# Patient Record
Sex: Female | Born: 1990
Health system: Southern US, Community
[De-identification: ages and names within clinical notes are randomized; demographics above are authoritative.]

## PROBLEM LIST (undated history)

## (undated) ENCOUNTER — Inpatient Hospital Stay (HOSPITAL_COMMUNITY): Payer: Self-pay

## (undated) DIAGNOSIS — Z789 Other specified health status: Secondary | ICD-10-CM

## (undated) HISTORY — PX: WISDOM TOOTH EXTRACTION: SHX21

## (undated) HISTORY — PX: APPENDECTOMY: SHX54

---

## 2013-05-02 ENCOUNTER — Emergency Department (HOSPITAL_COMMUNITY)
Admission: EM | Admit: 2013-05-02 | Discharge: 2013-05-02 | Disposition: A | Discharge status: Home / Self Care | Payer: BC Managed Care – PPO | Attending: Emergency Medicine | Admitting: Emergency Medicine

## 2013-05-02 ENCOUNTER — Encounter (HOSPITAL_COMMUNITY): Payer: Self-pay | Admitting: Emergency Medicine

## 2013-05-02 ENCOUNTER — Emergency Department (HOSPITAL_COMMUNITY): Payer: BC Managed Care – PPO

## 2013-05-02 DIAGNOSIS — Z9889 Other specified postprocedural states: Secondary | ICD-10-CM | POA: Insufficient documentation

## 2013-05-02 DIAGNOSIS — R1031 Right lower quadrant pain: Secondary | ICD-10-CM | POA: Insufficient documentation

## 2013-05-02 DIAGNOSIS — R109 Unspecified abdominal pain: Secondary | ICD-10-CM

## 2013-05-02 DIAGNOSIS — N898 Other specified noninflammatory disorders of vagina: Secondary | ICD-10-CM | POA: Insufficient documentation

## 2013-05-02 DIAGNOSIS — Z3202 Encounter for pregnancy test, result negative: Secondary | ICD-10-CM | POA: Insufficient documentation

## 2013-05-02 DIAGNOSIS — R112 Nausea with vomiting, unspecified: Secondary | ICD-10-CM | POA: Insufficient documentation

## 2013-05-02 DIAGNOSIS — Z880 Allergy status to penicillin: Secondary | ICD-10-CM | POA: Insufficient documentation

## 2013-05-02 DIAGNOSIS — R1032 Left lower quadrant pain: Secondary | ICD-10-CM | POA: Insufficient documentation

## 2013-05-02 DIAGNOSIS — M545 Low back pain, unspecified: Secondary | ICD-10-CM | POA: Insufficient documentation

## 2013-05-02 DIAGNOSIS — R630 Anorexia: Secondary | ICD-10-CM | POA: Insufficient documentation

## 2013-05-02 LAB — COMPREHENSIVE METABOLIC PANEL
ALT: 11 U/L (ref 0–35)
AST: 15 U/L (ref 0–37)
Albumin: 4.1 g/dL (ref 3.5–5.2)
Alkaline Phosphatase: 52 U/L (ref 39–117)
BUN: 13 mg/dL (ref 6–23)
CHLORIDE: 102 meq/L (ref 96–112)
CO2: 19 mEq/L (ref 19–32)
Calcium: 9.5 mg/dL (ref 8.4–10.5)
Creatinine, Ser: 0.99 mg/dL (ref 0.50–1.10)
GFR calc Af Amer: >90 mL/min (ref >90)
GFR calc non Af Amer: 79 mL/min — ABNORMAL LOW (ref >90)
Glucose, Bld: 82 mg/dL (ref 70–99)
Potassium: 3.7 mEq/L (ref 3.7–5.3)
Sodium: 138 mEq/L (ref 137–147)
Total Bilirubin: 0.4 mg/dL (ref 0.3–1.2)
Total Protein: 7.5 g/dL (ref 6.0–8.3)

## 2013-05-02 LAB — CBC WITH DIFFERENTIAL/PLATELET
BASOS ABS: 0 10*3/uL (ref 0.0–0.1)
Basophils Relative: 0 % (ref 0–1)
Eosinophils Absolute: 0 10*3/uL (ref 0.0–0.7)
Eosinophils Relative: 0 % (ref 0–5)
HCT: 37.5 % (ref 36.0–46.0)
Hemoglobin: 13.4 g/dL (ref 12.0–15.0)
LYMPHS ABS: 2 10*3/uL (ref 0.7–4.0)
Lymphocytes Relative: 15 % (ref 12–46)
MCH: 27.6 pg (ref 26.0–34.0)
MCHC: 35.7 g/dL (ref 30.0–36.0)
MCV: 77.2 fL — ABNORMAL LOW (ref 78.0–100.0)
MONOS PCT: 5 % (ref 3–12)
Monocytes Absolute: 0.6 10*3/uL (ref 0.1–1.0)
NEUTROS PCT: 80 % — AB (ref 43–77)
Neutro Abs: 10.6 10*3/uL — ABNORMAL HIGH (ref 1.7–7.7)
PLATELETS: 215 10*3/uL (ref 150–400)
RBC: 4.86 MIL/uL (ref 3.87–5.11)
RDW: 13.3 % (ref 11.5–15.5)
WBC: 13.3 10*3/uL — ABNORMAL HIGH (ref 4.0–10.5)

## 2013-05-02 LAB — URINALYSIS, ROUTINE W REFLEX MICROSCOPIC
Bilirubin Urine: NEGATIVE
GLUCOSE, UA: NEGATIVE mg/dL
Nitrite: NEGATIVE
Protein, ur: NEGATIVE mg/dL
Specific Gravity, Urine: 1.015 (ref 1.005–1.030)
Urobilinogen, UA: 1 mg/dL (ref 0.0–1.0)
pH: 6 (ref 5.0–8.0)

## 2013-05-02 LAB — WET PREP, GENITAL
Trich, Wet Prep: NONE SEEN
Yeast Wet Prep HPF POC: NONE SEEN

## 2013-05-02 LAB — URINE MICROSCOPIC-ADD ON

## 2013-05-02 LAB — POCT PREGNANCY, URINE: Preg Test, Ur: NEGATIVE

## 2013-05-02 LAB — LIPASE, BLOOD: Lipase: 33 U/L (ref 11–59)

## 2013-05-02 MED ORDER — ONDANSETRON HCL 4 MG/2ML IJ SOLN
4.0000 mg | Freq: Once | INTRAMUSCULAR | Status: AC
  Administered 2013-05-02: 4 mg via INTRAVENOUS
  Filled 2013-05-02: qty 2

## 2013-05-02 MED ORDER — ONDANSETRON 8 MG PO TBDP: 8.0000 mg | ORAL_TABLET | Freq: Three times a day (TID) | ORAL | Status: DC | PRN

## 2013-05-02 MED ORDER — IOHEXOL 300 MG/ML  SOLN
100.0000 mL | Freq: Once | INTRAMUSCULAR | Status: AC | PRN
  Administered 2013-05-02: 100 mL via INTRAVENOUS

## 2013-05-02 MED ORDER — HYDROMORPHONE HCL PF 1 MG/ML IJ SOLN
0.5000 mg | Freq: Once | INTRAMUSCULAR | Status: AC
  Administered 2013-05-02: 0.5 mg via INTRAVENOUS
  Filled 2013-05-02: qty 1

## 2013-05-02 MED ORDER — SODIUM CHLORIDE 0.9 % IV BOLUS (SEPSIS)
1000.0000 mL | Freq: Once | INTRAVENOUS | Status: AC
  Administered 2013-05-02: 1000 mL via INTRAVENOUS

## 2013-05-02 MED ORDER — HYDROCODONE-ACETAMINOPHEN 5-325 MG PO TABS: 1.0000 | ORAL_TABLET | ORAL | Status: DC | PRN

## 2013-05-02 MED ORDER — IOHEXOL 300 MG/ML  SOLN
50.0000 mL | Freq: Once | INTRAMUSCULAR | Status: AC | PRN
  Administered 2013-05-02: 50 mL via ORAL

## 2013-05-02 NOTE — Discharge Instructions (Signed)
You lab work and CT are normal except for elevated WBC. Take pain medications and nausea medications as prescribed. Drink plenty of fluids. Rest. If worsening or not improving, make sure to follow up and get rechecked with primary care doctor or here in ED.    Abdominal Pain, Women Abdominal (stomach, pelvic, or belly) pain can be caused by many things. It is important to tell your doctor:  The location of the pain.  Does it come and go or is it present all the time?  Are there things that start the pain (eating certain foods, exercise)?  Are there other symptoms associated with the pain (fever, nausea, vomiting, diarrhea)? All of this is helpful to know when trying to find the cause of the pain. CAUSES   Stomach: virus or bacteria infection, or ulcer.  Intestine: appendicitis (inflamed appendix), regional ileitis (Crohn's disease), ulcerative colitis (inflamed colon), irritable bowel syndrome, diverticulitis (inflamed diverticulum of the colon), or cancer of the stomach or intestine.  Gallbladder disease or stones in the gallbladder.  Kidney disease, kidney stones, or infection.  Pancreas infection or cancer.  Fibromyalgia (pain disorder).  Diseases of the female organs:  Uterus: fibroid (non-cancerous) tumors or infection.  Fallopian tubes: infection or tubal pregnancy.  Ovary: cysts or tumors.  Pelvic adhesions (scar tissue).  Endometriosis (uterus lining tissue growing in the pelvis and on the pelvic organs).  Pelvic congestion syndrome (female organs filling up with blood just before the menstrual period).  Pain with the menstrual period.  Pain with ovulation (producing an egg).  Pain with an IUD (intrauterine device, birth control) in the uterus.  Cancer of the female organs.  Functional pain (pain not caused by a disease, may improve without treatment).  Psychological pain.  Depression. DIAGNOSIS  Your doctor will decide the seriousness of your pain by  doing an examination.  Blood tests.  X-rays.  Ultrasound.  CT scan (computed tomography, special type of X-ray).  MRI (magnetic resonance imaging).  Cultures, for infection.  Barium enema (dye inserted in the large intestine, to better view it with X-rays).  Colonoscopy (looking in intestine with a lighted tube).  Laparoscopy (minor surgery, looking in abdomen with a lighted tube).  Major abdominal exploratory surgery (looking in abdomen with a large incision). TREATMENT  The treatment will depend on the cause of the pain.   Many cases can be observed and treated at home.  Over-the-counter medicines recommended by your caregiver.  Prescription medicine.  Antibiotics, for infection.  Birth control pills, for painful periods or for ovulation pain.  Hormone treatment, for endometriosis.  Nerve blocking injections.  Physical therapy.  Antidepressants.  Counseling with a psychologist or psychiatrist.  Minor or major surgery. HOME CARE INSTRUCTIONS   Do not take laxatives, unless directed by your caregiver.  Take over-the-counter pain medicine only if ordered by your caregiver. Do not take aspirin because it can cause an upset stomach or bleeding.  Try a clear liquid diet (broth or water) as ordered by your caregiver. Slowly move to a bland diet, as tolerated, if the pain is related to the stomach or intestine.  Have a thermometer and take your temperature several times a day, and record it.  Bed rest and sleep, if it helps the pain.  Avoid sexual intercourse, if it causes pain.  Avoid stressful situations.  Keep your follow-up appointments and tests, as your caregiver orders.  If the pain does not go away with medicine or surgery, you may try:  Acupuncture.  Relaxation  exercises (yoga, meditation).  Group therapy.  Counseling. SEEK MEDICAL CARE IF:   You notice certain foods cause stomach pain.  Your home care treatment is not helping your  pain.  You need stronger pain medicine.  You want your IUD removed.  You feel faint or lightheaded.  You develop nausea and vomiting.  You develop a rash.  You are having side effects or an allergy to your medicine. SEEK IMMEDIATE MEDICAL CARE IF:   Your pain does not go away or gets worse.  You have a fever.  Your pain is felt only in portions of the abdomen. The right side could possibly be appendicitis. The left lower portion of the abdomen could be colitis or diverticulitis.  You are passing blood in your stools (bright red or black tarry stools, with or without vomiting).  You have blood in your urine.  You develop chills, with or without a fever.  You pass out. MAKE SURE YOU:   Understand these instructions.  Will watch your condition.  Will get help right away if you are not doing well or get worse. Document Released: 01/12/2007 Document Revised: 06/09/2011 Document Reviewed: 02/01/2009 [facility] Patient Information 2014 [place], [place]. Viral Gastroenteritis Viral gastroenteritis is also known as stomach flu. This condition affects the stomach and intestinal tract. It can cause sudden diarrhea and vomiting. The illness typically lasts 3 to 8 days. Most people develop an immune response that eventually gets rid of the virus. While this natural response develops, the virus can make you quite ill. CAUSES  Many different viruses can cause gastroenteritis, such as rotavirus or noroviruses. You can catch one of these viruses by consuming contaminated food or water. You may also catch a virus by sharing utensils or other personal items with an infected person or by touching a contaminated surface. SYMPTOMS  The most common symptoms are diarrhea and vomiting. These problems can cause a severe loss of body fluids (dehydration) and a body salt (electrolyte) imbalance. Other symptoms may include:  Fever.  Headache.  Fatigue.  Abdominal pain. DIAGNOSIS  Your caregiver  can usually diagnose viral gastroenteritis based on your symptoms and a physical exam. A stool sample may also be taken to test for the presence of viruses or other infections. TREATMENT  This illness typically goes away on its own. Treatments are aimed at rehydration. The most serious cases of viral gastroenteritis involve vomiting so severely that you are not able to keep fluids down. In these cases, fluids must be given through an intravenous line (IV). HOME CARE INSTRUCTIONS   Drink enough fluids to keep your urine clear or pale yellow. Drink small amounts of fluids frequently and increase the amounts as tolerated.  Ask your caregiver for specific rehydration instructions.  Avoid:  Foods high in sugar.  Alcohol.  Carbonated drinks.  Tobacco.  Juice.  Caffeine drinks.  Extremely hot or cold fluids.  Fatty, greasy foods.  Too much intake of anything at one time.  Dairy products until 24 to 48 hours after diarrhea stops.  You may consume probiotics. Probiotics are active cultures of beneficial bacteria. They may lessen the amount and number of diarrheal stools in adults. Probiotics can be found in yogurt with active cultures and in supplements.  Wash your hands well to avoid spreading the virus.  Only take over-the-counter or prescription medicines for pain, discomfort, or fever as directed by your caregiver. Do not give aspirin to children. Antidiarrheal medicines are not recommended.  Ask your caregiver if you should  continue to take your regular prescribed and over-the-counter medicines.  Keep all follow-up appointments as directed by your caregiver. SEEK IMMEDIATE MEDICAL CARE IF:   You are unable to keep fluids down.  You do not urinate at least once every 6 to 8 hours.  You develop shortness of breath.  You notice blood in your stool or vomit. This may look like coffee grounds.  You have abdominal pain that increases or is concentrated in one small area  (localized).  You have persistent vomiting or diarrhea.  You have a fever.  The patient is a child younger than 3 months, and he or she has a fever.  The patient is a child older than 3 months, and he or she has a fever and persistent symptoms.  The patient is a child older than 3 months, and he or she has a fever and symptoms suddenly get worse.  The patient is a baby, and he or she has no tears when crying. MAKE SURE YOU:   Understand these instructions.  Will watch your condition.  Will get help right away if you are not doing well or get worse. Document Released: 03/17/2005 Document Revised: 06/09/2011 Document Reviewed: 01/01/2011 [facility] Patient Information 2014 [place], [place].

## 2013-05-02 NOTE — ED Notes (Signed)
Pt c/o bilateral flank pain since this morning, n/v, and numbness in fingers. No hx of kidney stones. No difficulty going to restroom.

## 2013-05-02 NOTE — Progress Notes (Signed)
   CARE MANAGEMENT ED NOTE 05/02/2013  Patient:  HUNT,Sarye   Account Number:  [id number]  Date Initiated:  05/02/2013  Documentation initiated by:  Radford PaxFERRERO,Taiya Nutting  Subjective/Objective Assessment:   Patient presents to Ed with bilateral flank pain since this morning, n/v, and numbness in fingers     Subjective/Objective Assessment Detail:   No pertinent pmhx     Action/Plan:   Action/Plan Detail:   Anticipated DC Date:       Status Recommendation to Physician:   Result of Recommendation:    Other ED Services  Consult Working Plan    DC Planning Services  Other  PCP issues    Choice offered to / List presented to:            Status of service:  Completed, signed off  ED Comments:   ED Comments Detail:  Patient confirms she does not have a pcp.  EDCM instructed patient to go to insurance company website to help her find a physician whois close to her and within network.  Patient verbalized understanding.  No further EDCM needs at this time.

## 2013-05-02 NOTE — ED Provider Notes (Signed)
CSN: [id number]     Arrival date & time 05/02/13  1804 History   First MD Initiated Contact with Patient 05/02/13 1903     Chief Complaint  Patient presents with  . Flank Pain    bilateral   (Consider location/radiation/quality/duration/timing/severity/associated sxs/prior Treatment) HPI Shannon Thomas is a 23 y.o. female who presents to ED with complaint of lower back pain, abdominal pain, nausea, vomiting. States has not had a bowel movement in two days, but that is not unusual for her. States main pain is in the back, worse with movement, walking. No pain with urination, no urinary frequency or urgency. States she does have some vaginal spotting. States had her mirena removed 1 month ago, not on any OCPs at this time, but denies pregnancy. States pain radiates to bilateral lower abdomen. She took ibuprofen with no pain relief. States has not eaten much today due nausea, vomiting, and loss of appetite. Denies any known fever. States similar pain when had appendicitis.   History reviewed. No pertinent past medical history. Past Surgical History  Procedure Laterality Date  . Appendectomy     No family history on file. History  Substance Use Topics  . Smoking status: Never Smoker   . Smokeless tobacco: Not on file  . Alcohol Use: No   OB History   Grav Para Term Preterm Abortions TAB SAB Ect Mult Living                 Review of Systems  Constitutional: Negative for fever and chills.  Respiratory: Negative for cough, chest tightness and shortness of breath.   Cardiovascular: Negative for chest pain, palpitations and leg swelling.  Gastrointestinal: Positive for nausea, vomiting and abdominal pain. Negative for diarrhea.  Genitourinary: Positive for vaginal bleeding. Negative for dysuria, frequency, hematuria, flank pain, vaginal discharge, vaginal pain and pelvic pain.  Musculoskeletal: Negative for arthralgias, myalgias, neck pain and neck stiffness.  Skin: Negative for rash.   Neurological: Negative for dizziness, weakness and headaches.  All other systems reviewed and are negative.    Allergies  Amoxicillin and Penicillins  Home Medications   Current Outpatient Rx  Name  Route  Sig  Dispense  Refill  . ibuprofen (ADVIL,MOTRIN) 200 MG tablet   Oral   Take 600 mg by mouth every 6 (six) hours as needed for moderate pain.          BP 110/81  Pulse 115  Temp(Src) 98.7 F (37.1 C) (Oral)  Resp 20  SpO2 100%  LMP 04/08/2013 Physical Exam  Nursing note and vitals reviewed. Constitutional: She is oriented to person, place, and time. She appears well-developed and well-nourished. No distress.  HENT:  Head: Normocephalic.  Eyes: Conjunctivae are normal.  Neck: Neck supple.  Cardiovascular: Normal rate, regular rhythm and normal heart sounds.   Pulmonary/Chest: Effort normal and breath sounds normal. No respiratory distress. She has no wheezes. She has no rales.  Abdominal: Soft. Bowel sounds are normal. She exhibits no distension. There is tenderness. There is no rebound.  Suprapubic and LLQ and RLQ tenderness. Bilateral CVA tenderness  Genitourinary:  Normal external genitalia. Minimal white thin vaginal discharge. No CMT. Uterine and right adnexal tenderness  Musculoskeletal: She exhibits no edema.  Neurological: She is alert and oriented to person, place, and time.  Skin: Skin is warm and dry.  Psychiatric: She has a normal mood and affect. Her behavior is normal.    ED Course  Procedures (including critical care time) Labs Review Labs Reviewed  WET PREP, GENITAL - Abnormal; Notable for the following:    Clue Cells Wet Prep HPF POC RARE (*)    WBC, Wet Prep HPF POC MODERATE (*)    All other components within normal limits  CBC WITH DIFFERENTIAL - Abnormal; Notable for the following:    WBC 13.3 (*)    MCV 77.2 (*)    Neutrophils Relative % 80 (*)    Neutro Abs 10.6 (*)    All other components within normal limits  COMPREHENSIVE  METABOLIC PANEL - Abnormal; Notable for the following:    GFR calc non Af Amer 79 (*)    All other components within normal limits  URINALYSIS, ROUTINE W REFLEX MICROSCOPIC - Abnormal; Notable for the following:    APPearance CLOUDY (*)    Hgb urine dipstick TRACE (*)    Ketones, ur >80 (*)    Leukocytes, UA SMALL (*)    All other components within normal limits  GC/CHLAMYDIA PROBE AMP  LIPASE, BLOOD  URINE MICROSCOPIC-ADD ON  POCT PREGNANCY, URINE   Imaging Review Ct Abdomen Pelvis W Contrast  05/02/2013   CLINICAL DATA:  Bilateral flank pain with nausea and vomiting beginning this morning.  EXAM: CT ABDOMEN AND PELVIS WITH CONTRAST  TECHNIQUE: Multidetector CT imaging of the abdomen and pelvis was performed using the standard protocol following bolus administration of intravenous contrast.  CONTRAST:  50 mL OMNIPAQUE IOHEXOL 300 MG/ML SOLN, 100 mL OMNIPAQUE IOHEXOL 300 MG/ML SOLN  COMPARISON:  None.  FINDINGS: The lung bases are clear.  No pleural or pericardial effusion.  The kidneys appear normal bilaterally without stone, mass or hydronephrosis. No ureteral or urinary bladder stones are identified. The gallbladder, liver, spleen, adrenal glands, pancreas and biliary tree all appear normal. The appendix has been removed. There is a small volume of free pelvic fluid compatible with physiologic change. Uterus and adnexa unremarkable. There is no lymphadenopathy. No focal bony abnormality is identified.  IMPRESSION: No acute finding or finding to explain the patient's symptoms. Status post appendectomy.   Electronically Signed   By: Drusilla Kannerhomas  Dalessio M.D.   On: 05/02/2013 22:47    EKG Interpretation   None       MDM   1. Abdominal pain   2. Nausea & vomiting     Pt in ED with back pain, abdominal pain, nausea, vomiting. No diarrhea. Labs obtained. WBC elevated. Fluids and zofran/dilaudid ordered. UA does not appear to be infected. Pelvic exam unremarkable.  Will get CT for further  evaluation.    CT negative. Pt feeling better. Tolerating PO fluids. HOme with anti emetics, pain medication, recheck tomorrow. Discussed findings with pt.  Instructed to return if worsening.  Medications  sodium chloride 0.9 % bolus 1,000 mL (0 mLs Intravenous Stopped 05/02/13 2058)  ondansetron (ZOFRAN) injection 4 mg (4 mg Intravenous Given 05/02/13 1925)  HYDROmorphone (DILAUDID) injection 0.5 mg (0.5 mg Intravenous Given 05/02/13 1925)  sodium chloride 0.9 % bolus 1,000 mL (0 mLs Intravenous Stopped 05/02/13 2214)  HYDROmorphone (DILAUDID) injection 0.5 mg (0.5 mg Intravenous Given 05/02/13 2058)  iohexol (OMNIPAQUE) 300 MG/ML solution 50 mL (50 mLs Oral Contrast Given 05/02/13 2132)  iohexol (OMNIPAQUE) 300 MG/ML solution 100 mL (100 mLs Intravenous Contrast Given 05/02/13 2222)  ondansetron (ZOFRAN) injection 4 mg (4 mg Intravenous Given 05/02/13 2322)       Lemont Fillersatyana A Christofer Shen, PA-C 05/03/13 0019

## 2013-05-04 LAB — GC/CHLAMYDIA PROBE AMP
CT PROBE, AMP APTIMA: NEGATIVE
GC Probe RNA: NEGATIVE

## 2013-05-06 NOTE — ED Provider Notes (Signed)
Medical screening examination/treatment/procedure(s) were performed by non-physician practitioner and as supervising physician I was immediately available for consultation/collaboration.  Clariece Roesler M Tinamarie Przybylski, MD 05/06/13 1951 

## 2015-01-20 IMAGING — CT CT ABD-PELV W/ CM
1 of 2 series · 15 of 32 positions shown, 19 images · IV contrast (OMNIPAQUE 300)
Comparison: None.

CLINICAL DATA: Bilateral flank pain with nausea and vomiting
beginning this morning.

EXAM:
CT ABDOMEN AND PELVIS WITH CONTRAST
TECHNIQUE: Multidetector CT imaging of the abdomen and pelvis was performed
using the standard protocol following bolus administration of
intravenous contrast.
CONTRAST:  50 mL OMNIPAQUE IOHEXOL 300 MG/ML SOLN, 100 mL OMNIPAQUE
IOHEXOL 300 MG/ML SOLN

[Series 2: abd/pel with · axial · 0.75mm/px · z∈[-436,-21]mm · 15 of 93 slices shown, 19 images]
[im 5/93  soft-tissue]
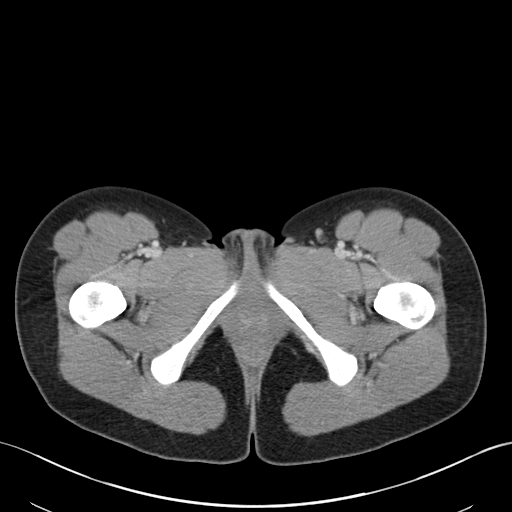
[im 5/93  bone]
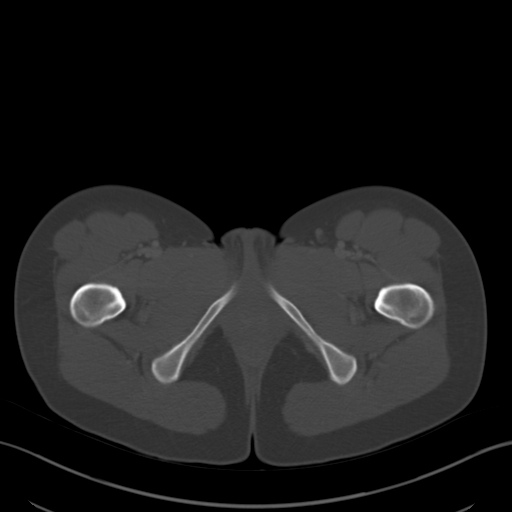
[im 13/93  soft-tissue]
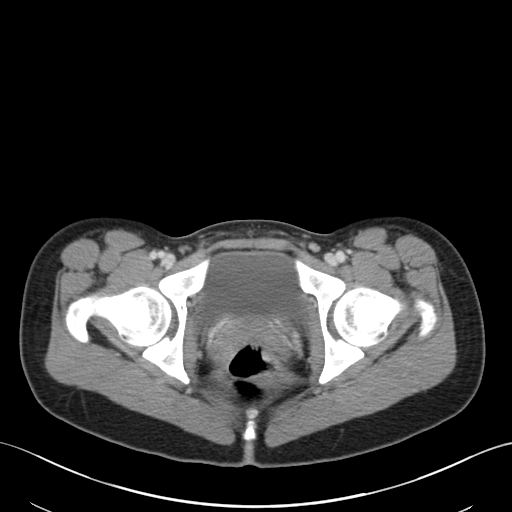
[im 21/93  soft-tissue]
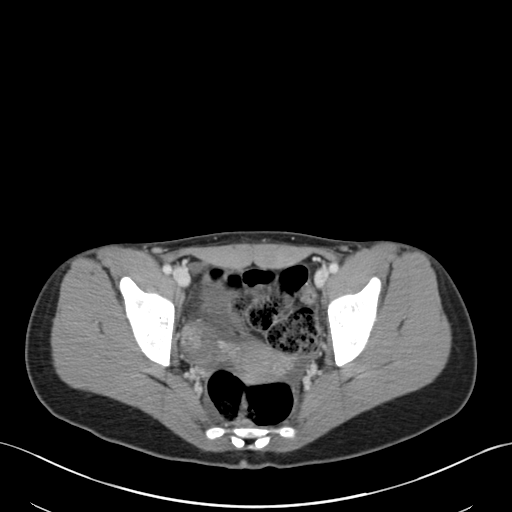
[im 26/93  soft-tissue]
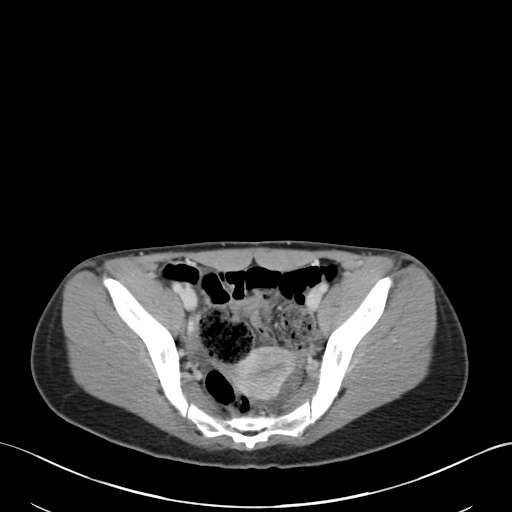
[im 34/93  soft-tissue]
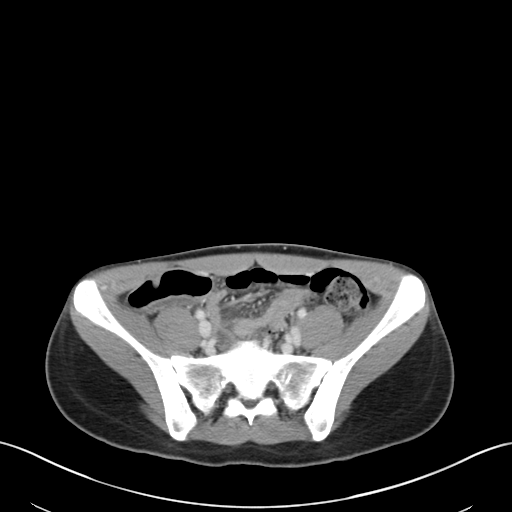
[im 38/93  soft-tissue]
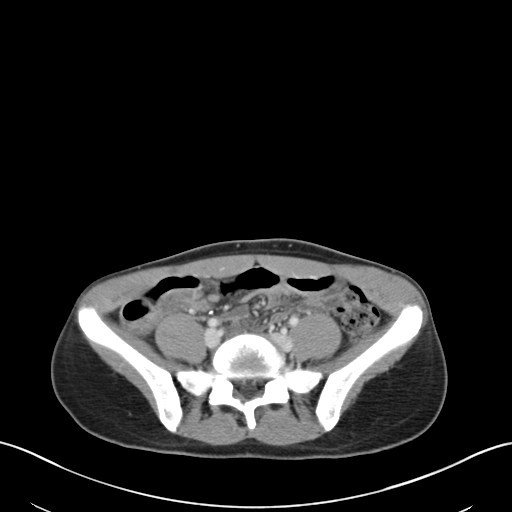
[im 47/93  soft-tissue]
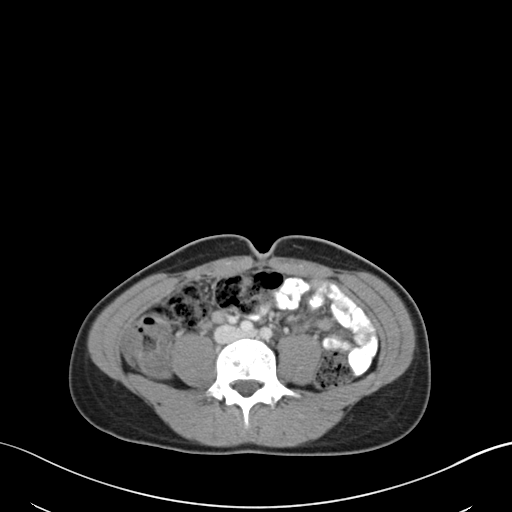
[im 55/93  soft-tissue]
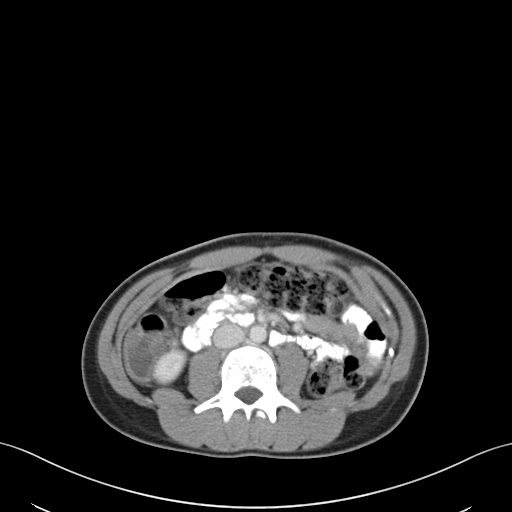
[im 59/93  soft-tissue]
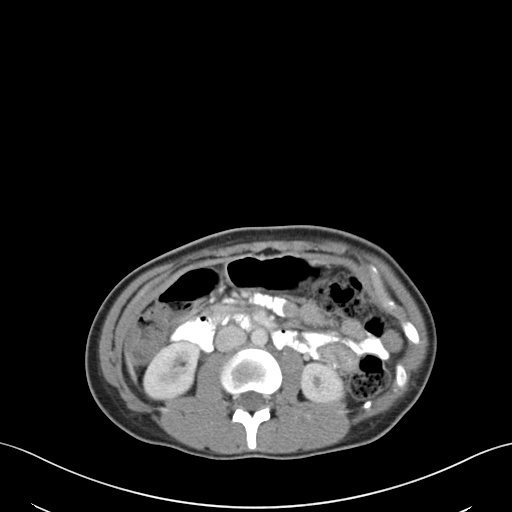
[im 59/93  bone]
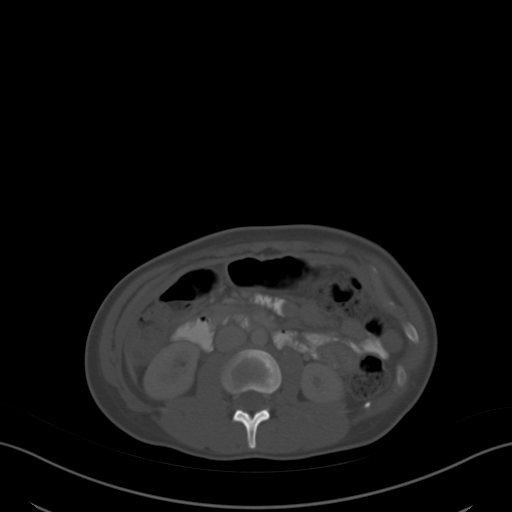
[im 67/93  soft-tissue]
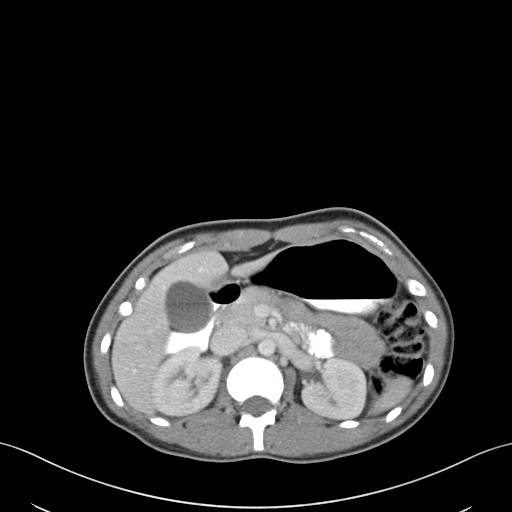
[im 72/93  soft-tissue]
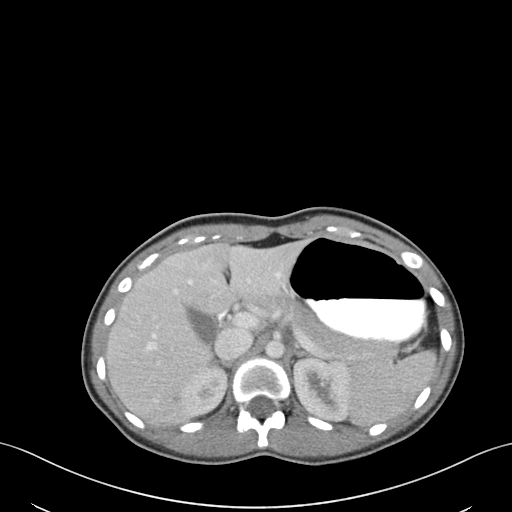
[im 76/93  lung]
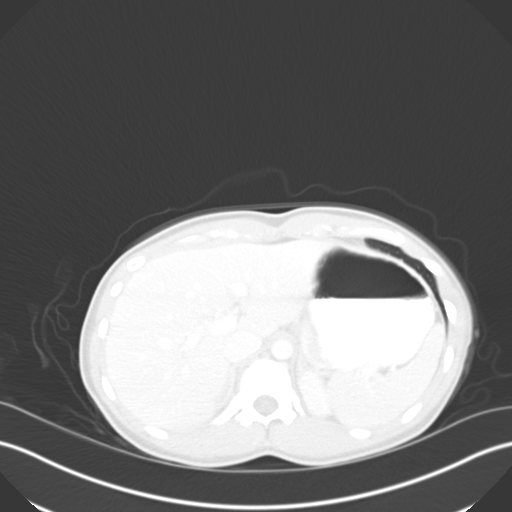
[im 80/93  soft-tissue]
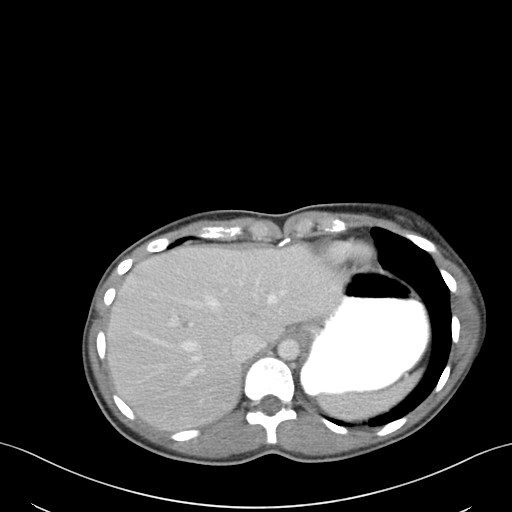
[im 80/93  lung]
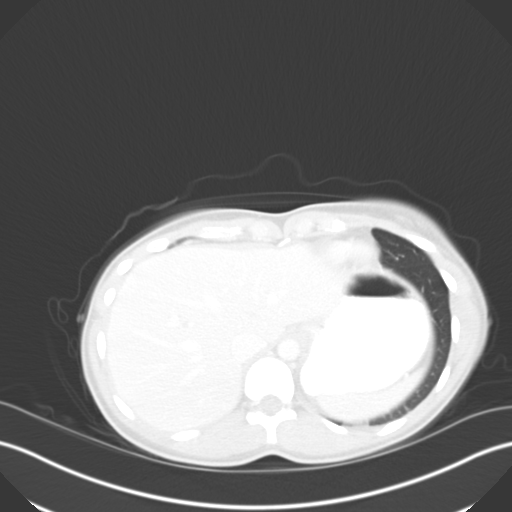
[im 84/93  lung]
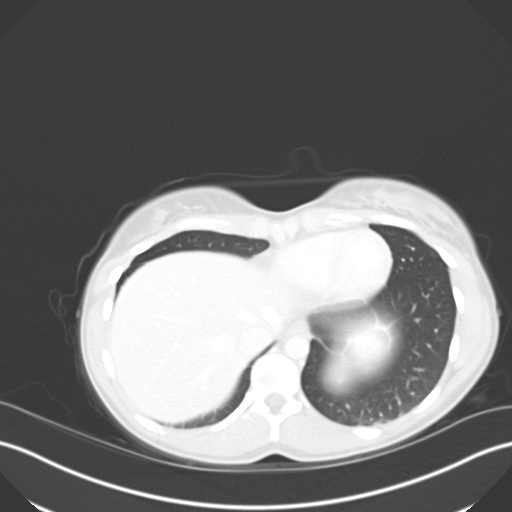
[im 88/93  soft-tissue]
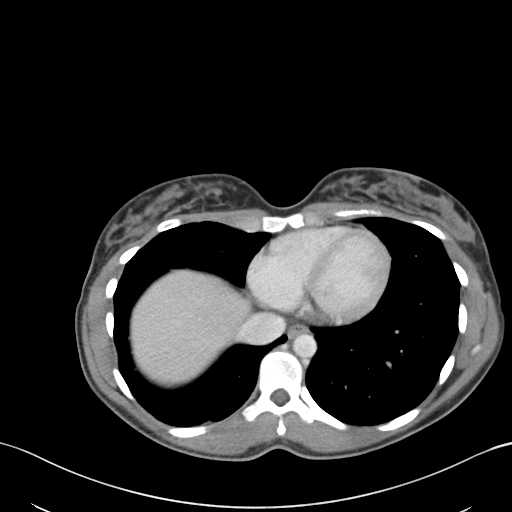
[im 88/93  lung]
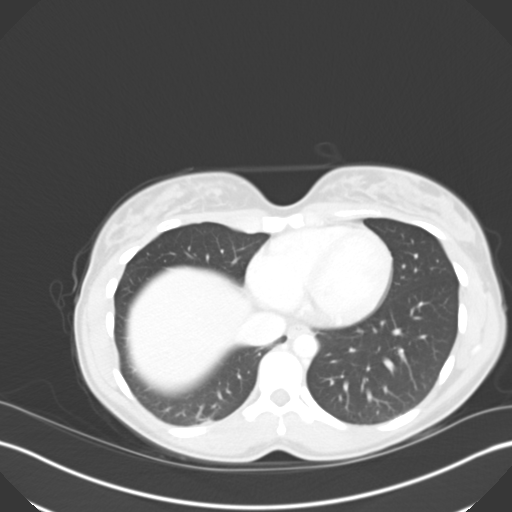

[15 of 32 positions shown; findings below may reference images not displayed]

FINDINGS: The lung bases are clear.  No pleural or pericardial effusion.

The kidneys appear normal bilaterally without stone, mass or
hydronephrosis. No ureteral or urinary bladder stones are
identified. The gallbladder, liver, spleen, adrenal glands, pancreas
and biliary tree all appear normal. The appendix has been removed.
There is a small volume of free pelvic fluid compatible with
physiologic change. Uterus and adnexa unremarkable. There is no
lymphadenopathy. No focal bony abnormality is identified.
IMPRESSION: No acute finding or finding to explain the patient's symptoms.
Status post appendectomy.

## 2016-03-28 LAB — OB RESULTS CONSOLE GC/CHLAMYDIA
CHLAMYDIA, DNA PROBE: NEGATIVE
Gonorrhea: NEGATIVE

## 2016-03-31 NOTE — L&D Delivery Note (Signed)
Pt progressed along a nl labor curve without difficulty. She pushed for 2 hours. She had a SVD of one live viable white female over a second degree midline tear in the ROA position. Placenta-S/I. EBL-400cc/ Baby to NBN

## 2016-04-01 ENCOUNTER — Inpatient Hospital Stay (HOSPITAL_COMMUNITY)
Admission: AD | Admit: 2016-04-01 | Discharge: 2016-04-01 | Disposition: A | Discharge status: Home / Self Care | Payer: PRIVATE HEALTH INSURANCE | Source: Ambulatory Visit | Attending: Obstetrics and Gynecology | Admitting: Obstetrics and Gynecology

## 2016-04-01 ENCOUNTER — Encounter (HOSPITAL_COMMUNITY): Payer: Self-pay | Admitting: *Deleted

## 2016-04-01 DIAGNOSIS — Z3A01 Less than 8 weeks gestation of pregnancy: Secondary | ICD-10-CM | POA: Diagnosis not present

## 2016-04-01 DIAGNOSIS — O210 Mild hyperemesis gravidarum: Secondary | ICD-10-CM | POA: Insufficient documentation

## 2016-04-01 DIAGNOSIS — Z880 Allergy status to penicillin: Secondary | ICD-10-CM | POA: Insufficient documentation

## 2016-04-01 DIAGNOSIS — O99611 Diseases of the digestive system complicating pregnancy, first trimester: Secondary | ICD-10-CM | POA: Insufficient documentation

## 2016-04-01 DIAGNOSIS — K219 Gastro-esophageal reflux disease without esophagitis: Secondary | ICD-10-CM | POA: Insufficient documentation

## 2016-04-01 LAB — URINALYSIS, ROUTINE W REFLEX MICROSCOPIC
BILIRUBIN URINE: NEGATIVE
Glucose, UA: >=500 mg/dL — AB
HGB URINE DIPSTICK: NEGATIVE
KETONES UR: 20 mg/dL — AB
NITRITE: NEGATIVE
Protein, ur: NEGATIVE mg/dL
Specific Gravity, Urine: 1.003 — ABNORMAL LOW (ref 1.005–1.030)
pH: 6 (ref 5.0–8.0)

## 2016-04-01 LAB — POCT PREGNANCY, URINE: Preg Test, Ur: POSITIVE — AB

## 2016-04-01 MED ORDER — DEXTROSE 5 % IN LACTATED RINGERS IV BOLUS
1000.0000 mL | Freq: Once | INTRAVENOUS | Status: AC
  Administered 2016-04-01: 1000 mL via INTRAVENOUS

## 2016-04-01 MED ORDER — RANITIDINE HCL 150 MG PO TABS: 150.0000 mg | ORAL_TABLET | Freq: Two times a day (BID) | ORAL | 0 refills | Status: DC

## 2016-04-01 MED ORDER — FAMOTIDINE IN NACL 20-0.9 MG/50ML-% IV SOLN
20.0000 mg | Freq: Once | INTRAVENOUS | Status: AC
  Administered 2016-04-01: 20 mg via INTRAVENOUS
  Filled 2016-04-01: qty 50

## 2016-04-01 MED ORDER — PROMETHAZINE HCL 25 MG/ML IJ SOLN
25.0000 mg | Freq: Once | INTRAMUSCULAR | Status: AC
  Administered 2016-04-01: 25 mg via INTRAVENOUS
  Filled 2016-04-01: qty 1

## 2016-04-01 MED ORDER — ONDANSETRON 4 MG PO TBDP: 4.0000 mg | ORAL_TABLET | Freq: Three times a day (TID) | ORAL | 0 refills | Status: DC | PRN

## 2016-04-01 MED ORDER — M.V.I. ADULT IV INJ
Freq: Once | INTRAVENOUS | Status: AC
  Administered 2016-04-01: 19:00:00 via INTRAVENOUS
  Filled 2016-04-01: qty 1000

## 2016-04-01 MED ORDER — PROMETHAZINE HCL 25 MG RE SUPP: 25.0000 mg | Freq: Four times a day (QID) | RECTAL | 0 refills | Status: DC | PRN

## 2016-04-01 NOTE — Discharge Instructions (Signed)
Eating Plan for Hyperemesis Gravidarum °Hyperemesis gravidarum is a severe form of morning sickness. Because this condition causes severe nausea and vomiting, it can lead to dehydration, malnutrition, and weight loss. One way to lessen the symptoms of nausea and vomiting is to follow the eating plan for hyperemesis gravidarum. It is often used along with prescribed medicines to control your symptoms. °What can I do to relieve my symptoms? °Listen to your body. Everyone is different and has different preferences. Find what works best for you. Take any of the following actions that are helpful to you: °· Eat and drink slowly. °· Eat 5-6 small meals daily instead of 3 large meals. °· Eat crackers before you get out of bed in the morning. °· Try having a snack in the middle of the night. °· Starchy foods are usually tolerated well. Examples include cereal, toast, bread, potatoes, pasta, rice, and pretzels. °· Ginger may help with nausea. Add ¼ tsp ground ginger to hot tea or choose ginger tea. °· Try drinking 100% fruit juice or an electrolyte drink. An electrolyte drink contains sodium, potassium, and chloride. °· Continue to take your prenatal vitamins as told by your health care provider. If you are having trouble taking your prenatal vitamins, talk with your health care provider about different options. °· Include at least 1 serving of protein with your meals and snacks. Protein options include meats or poultry, beans, nuts, eggs, and yogurt. Try eating a protein-rich snack before bed. Examples of these snacks include cheese and crackers or half of a peanut butter or turkey sandwich. °· Consider eliminating foods that trigger your symptoms. These may include spicy foods, coffee, high-fat foods, very sweet foods, and acidic foods. °· Try meals that have more protein combined with bland, salty, lower-fat, and dry foods, such as nuts, seeds, pretzels, crackers, and cereal. °· Talk with your healthcare provider about  starting a supplement of vitamin B6. °· Have fluids that are cold, clear, and carbonated or sour. Examples include lemonade, ginger ale, lemon-lime soda, ice water, and sparkling water. °· Try lemon or mint tea. °· Try brushing your teeth or using a mouth rinse after meals. °What should I avoid to reduce my symptoms? °Avoiding some of the following things may help reduce your symptoms. °· Foods with strong smells. Try eating meals in well-ventilated areas that are free of odors. °· Drinking water or other beverages with meals. Try not to drink anything during the 30 minutes before and after your meals. °· Drinking more than 1 cup of fluid at a time. Sometimes using a straw helps. °· Fried or high-fat foods, such as butter and cream sauces. °· Spicy foods. °· Skipping meals as best as you can. Nausea can be more intense on an empty stomach. If you cannot tolerate food at that time, do not force it. Try sucking on ice chips or other frozen items, and make up for missed calories later. °· Lying down within 2 hours after eating. °· Environmental triggers. These may include smoky rooms, closed spaces, rooms with strong smells, warm or humid places, overly loud and noisy rooms, and rooms with motion or flickering lights. °· Quick and sudden changes in your movement. °This information is not intended to replace advice given to you by your health care provider. Make sure you discuss any questions you have with your health care provider. °Document Released: 01/12/2007 Document Revised: 11/14/2015 Document Reviewed: 10/16/2015 °Elsevier Interactive Patient Education © 2017 [employer] ° °

## 2016-04-01 NOTE — MAU Note (Signed)
Hasn't kept down anything solid since Sat.  Can't keep any fluids, meds or food down. abd and back hurting from throwing up

## 2016-04-01 NOTE — MAU Note (Signed)
Phlebotomy unable to draw labs.  RN attempted and unsuccessful.  Patient refusing to have labs drawn at this time.  NP notified.

## 2016-04-01 NOTE — MAU Provider Note (Signed)
History     CSN: [id number]  Arrival date and time: 04/01/16 1730   None     No chief complaint on file.  HPI   Ms.Shannon Thomas is a 26 y.o. female G1P0 @ [redacted]w[redacted]d here in MAU with nausea/vomiting. She was started on Diclegis last Wednesday and had been taking 2 tabs at bedtime only. Initially she felt it was working, however that now she feels her symptoms are worse despite taking the medication.  She tried taking a pill last night but could not keep it down. Says she has trouble swallowing pills.   She is vomiting more than 5 times per day. She has had a decrease in energy levels and feels bad most of the day.   OB History    Gravida Para Term Preterm AB Living   1             SAB TAB Ectopic Multiple Live Births                  History reviewed. No pertinent past medical history.  Past Surgical History:  Procedure Laterality Date  . APPENDECTOMY      History reviewed. No pertinent family history.  Social History  Substance Use Topics  . Smoking status: Never Smoker  . Smokeless tobacco: Never Used  . Alcohol use No    Allergies:  Allergies  Allergen Reactions  . Amoxicillin Hives  . Penicillins Hives    Prescriptions Prior to Admission  Medication Sig Dispense Refill Last Dose  . HYDROcodone-acetaminophen (NORCO/VICODIN) 5-325 MG per tablet Take 1-2 tablets by mouth every 4 (four) hours as needed. 20 tablet 0   . ibuprofen (ADVIL,MOTRIN) 200 MG tablet Take 600 mg by mouth every 6 (six) hours as needed for moderate pain.   05/01/2013 at Unknown time  . ondansetron (ZOFRAN ODT) 8 MG disintegrating tablet Take 1 tablet (8 mg total) by mouth every 8 (eight) hours as needed for nausea or vomiting. 20 tablet 0    Results for orders placed or performed during the hospital encounter of 04/01/16 (from the past 48 hour(s))  Urinalysis, Routine w reflex microscopic     Status: Abnormal   Collection Time: 04/01/16  7:46 PM  Result Value Ref Range   Color, Urine STRAW  (A) YELLOW   APPearance CLEAR CLEAR   Specific Gravity, Urine 1.003 (L) 1.005 - 1.030   pH 6.0 5.0 - 8.0   Glucose, UA >=500 (A) NEGATIVE mg/dL   Hgb urine dipstick NEGATIVE NEGATIVE   Bilirubin Urine NEGATIVE NEGATIVE   Ketones, ur 20 (A) NEGATIVE mg/dL   Protein, ur NEGATIVE NEGATIVE mg/dL   Nitrite NEGATIVE NEGATIVE   Leukocytes, UA LARGE (A) NEGATIVE   RBC / HPF 0-5 0 - 5 RBC/hpf   WBC, UA 0-5 0 - 5 WBC/hpf   Bacteria, UA RARE (A) NONE SEEN   Squamous Epithelial / LPF 0-5 (A) NONE SEEN  Pregnancy, urine POC     Status: Abnormal   Collection Time: 04/01/16  7:55 PM  Result Value Ref Range   Preg Test, Ur POSITIVE (A) NEGATIVE    Comment:        THE SENSITIVITY OF THIS METHODOLOGY IS >24 mIU/mL     Review of Systems  Constitutional: Positive for chills. Negative for fever.  Gastrointestinal: Positive for heartburn, nausea and vomiting. Negative for abdominal pain and diarrhea.  Genitourinary: Negative for dysuria and urgency.       Denies vaginal bleeding.    Physical  Exam   Blood pressure 122/77, pulse 116, temperature 97.7 F (36.5 C), temperature source Oral, resp. rate 18, weight 125 lb 6.4 oz (56.9 kg), last menstrual period 02/09/2016.  Physical Exam  Constitutional: She is oriented to person, place, and time. She appears well-developed and well-nourished. She has a sickly appearance. No distress.  HENT:  Head: Normocephalic.  Cardiovascular: Normal rate.   Respiratory: Effort normal and breath sounds normal.  Musculoskeletal: Normal range of motion.  Neurological: She is alert and oriented to person, place, and time.  Skin: Skin is warm. She is not diaphoretic.  Psychiatric: Her behavior is normal.    MAU Course  Procedures None  MDM  D5LR X 1 Liter MVI X 1 Liter Phenergan 25 mg IV Pepcid 20 mg IV  Patient tolerating ice chips.  CMP ordered- patient stuck multiple times without success. Patient refused lab due to this reason.  Discussed patient  with Dr. Claiborne Billingsallahan.   Assessment and Plan   A:  1. Hyperemesis complicating pregnancy, antepartum   2. Gastroesophageal reflux disease, esophagitis presence not specified     P:  Discharge home in stable condition Rx: Phenergan suppository, Zofran ODT, Zantac Return to MAU if symptoms worsen Small, frequent meals  Duane LopeJennifer I Jaedyn Lard, NP 04/02/2016 8:31 AM

## 2016-04-03 LAB — CULTURE, OB URINE

## 2016-04-07 ENCOUNTER — Encounter (HOSPITAL_COMMUNITY): Payer: Self-pay | Admitting: *Deleted

## 2016-04-07 ENCOUNTER — Inpatient Hospital Stay (HOSPITAL_COMMUNITY)
Admission: AD | Admit: 2016-04-07 | Discharge: 2016-04-07 | Disposition: A | Discharge status: Home / Self Care | Payer: PRIVATE HEALTH INSURANCE | Source: Ambulatory Visit | Attending: Obstetrics and Gynecology | Admitting: Obstetrics and Gynecology

## 2016-04-07 DIAGNOSIS — Z880 Allergy status to penicillin: Secondary | ICD-10-CM | POA: Diagnosis not present

## 2016-04-07 DIAGNOSIS — Z3A08 8 weeks gestation of pregnancy: Secondary | ICD-10-CM | POA: Insufficient documentation

## 2016-04-07 DIAGNOSIS — O210 Mild hyperemesis gravidarum: Secondary | ICD-10-CM | POA: Diagnosis present

## 2016-04-07 LAB — CBC
HCT: 34.2 % — ABNORMAL LOW (ref 36.0–46.0)
Hemoglobin: 12.2 g/dL (ref 12.0–15.0)
MCH: 27.1 pg (ref 26.0–34.0)
MCHC: 35.7 g/dL (ref 30.0–36.0)
MCV: 75.8 fL — ABNORMAL LOW (ref 78.0–100.0)
PLATELETS: 231 10*3/uL (ref 150–400)
RBC: 4.51 MIL/uL (ref 3.87–5.11)
RDW: 13.4 % (ref 11.5–15.5)
WBC: 9.6 10*3/uL (ref 4.0–10.5)

## 2016-04-07 LAB — URINALYSIS, ROUTINE W REFLEX MICROSCOPIC
BILIRUBIN URINE: NEGATIVE
Glucose, UA: NEGATIVE mg/dL
KETONES UR: 20 mg/dL — AB
NITRITE: NEGATIVE
Protein, ur: NEGATIVE mg/dL
Specific Gravity, Urine: 1.004 — ABNORMAL LOW (ref 1.005–1.030)
pH: 6 (ref 5.0–8.0)

## 2016-04-07 LAB — COMPREHENSIVE METABOLIC PANEL
ALT: 8 U/L — AB (ref 14–54)
AST: 14 U/L — AB (ref 15–41)
Albumin: 3.9 g/dL (ref 3.5–5.0)
Alkaline Phosphatase: 33 U/L — ABNORMAL LOW (ref 38–126)
Anion gap: 8 (ref 5–15)
BUN: 11 mg/dL (ref 6–20)
CHLORIDE: 104 mmol/L (ref 101–111)
CO2: 22 mmol/L (ref 22–32)
CREATININE: 0.69 mg/dL (ref 0.44–1.00)
Calcium: 9.2 mg/dL (ref 8.9–10.3)
GFR calc non Af Amer: >60 mL/min (ref >60)
Glucose, Bld: 77 mg/dL (ref 65–99)
Potassium: 4 mmol/L (ref 3.5–5.1)
Sodium: 134 mmol/L — ABNORMAL LOW (ref 135–145)
Total Bilirubin: 0.4 mg/dL (ref 0.3–1.2)
Total Protein: 6.9 g/dL (ref 6.5–8.1)

## 2016-04-07 MED ORDER — PROMETHAZINE HCL 25 MG/ML IJ SOLN: 25.0000 mg | Freq: Four times a day (QID) | INTRAMUSCULAR | Status: DC | PRN

## 2016-04-07 MED ORDER — THIAMINE HCL 100 MG/ML IJ SOLN
Freq: Once | INTRAVENOUS | Status: DC
  Filled 2016-04-07: qty 1000

## 2016-04-07 MED ORDER — SODIUM CHLORIDE 0.9 % IV SOLN
Freq: Once | INTRAVENOUS | Status: AC
  Administered 2016-04-07: 12:00:00 via INTRAVENOUS
  Filled 2016-04-07: qty 1000

## 2016-04-07 MED ORDER — SODIUM CHLORIDE 0.9 % IV SOLN
25.0000 mg | Freq: Once | INTRAVENOUS | Status: AC
  Administered 2016-04-07: 25 mg via INTRAVENOUS
  Filled 2016-04-07: qty 1

## 2016-04-07 NOTE — MAU Provider Note (Signed)
Patient Shannon Thomas is a 26 year old G1P0 here at 8 weeks and 2 days with complaints of nausea and vomiting. She has had nausea and vomiting for the past several weeks. She took last week off to rest and when she went back to work today it got a lot worse.  History     CSN: [id number]  Arrival date and time: 04/07/16 4098   First Provider Initiated Contact with Patient 04/07/16 1102      Chief Complaint  Patient presents with  . Emesis During Pregnancy   Emesis   This is a recurrent problem. The current episode started in the past 7 days. The problem occurs 5 to 10 times per day. The problem has been gradually worsening. The emesis has an appearance of bile. There has been no fever. Pertinent negatives include no abdominal pain, arthralgias, chest pain, chills, coughing, diarrhea, dizziness, fever, headaches, myalgias, sweats, URI or weight loss. She has tried diet change for the symptoms.      History reviewed. No pertinent past medical history.  Past Surgical History:  Procedure Laterality Date  . APPENDECTOMY      History reviewed. No pertinent family history.  Social History  Substance Use Topics  . Smoking status: Never Smoker  . Smokeless tobacco: Never Used  . Alcohol use No    Allergies:  Allergies  Allergen Reactions  . Amoxicillin Hives    Has patient had a PCN reaction causing immediate rash, facial/tongue/throat swelling, SOB or lightheadedness with hypotension: Yes Has patient had a PCN reaction causing severe rash involving mucus membranes or skin necrosis: Yes Has patient had a PCN reaction that required hospitalization No Has patient had a PCN reaction occurring within the last 10 years: No If all of the above answers are "NO", then may proceed with Cephalosporin use.   Marland Kitchen Penicillins Hives    Has patient had a PCN reaction causing immediate rash, facial/tongue/throat swelling, SOB or lightheadedness with hypotension: Yes Has patient had a PCN  reaction causing severe rash involving mucus membranes or skin necrosis: Yes Has patient had a PCN reaction that required hospitalization No Has patient had a PCN reaction occurring within the last 10 years: No If all of the above answers are "NO", then may proceed with Cephalosporin use.    Prescriptions Prior to Admission  Medication Sig Dispense Refill Last Dose  . anti-nausea (EMETROL) solution Take 15 mLs by mouth every 15 (fifteen) minutes as needed for nausea or vomiting.   04/07/2016 at Unknown time  . ondansetron (ZOFRAN ODT) 4 MG disintegrating tablet Take 1 tablet (4 mg total) by mouth every 8 (eight) hours as needed for nausea or vomiting. 20 tablet 0 04/07/2016 at Unknown time  . promethazine (PHENERGAN) 25 MG suppository Place 1 suppository (25 mg total) rectally every 6 (six) hours as needed for nausea or vomiting. 12 each 0 04/06/2016 at Unknown time  . ranitidine (ZANTAC) 150 MG tablet Take 1 tablet (150 mg total) by mouth 2 (two) times daily. (Patient not taking: Reported on 04/07/2016) 60 tablet 0 Not Taking at Unknown time    Review of Systems  Constitutional: Negative for chills, fever and weight loss.  HENT: Negative.   Eyes: Negative.   Respiratory: Negative for cough.   Cardiovascular: Negative for chest pain.  Gastrointestinal: Positive for vomiting. Negative for abdominal pain and diarrhea.  Endocrine: Negative.   Genitourinary: Negative.   Musculoskeletal: Negative for arthralgias and myalgias.  Allergic/Immunologic: Negative.   Neurological: Negative for dizziness  and headaches.  Psychiatric/Behavioral: Negative.    Physical Exam   Blood pressure 111/77, pulse 98, temperature 97.4 F (36.3 C), temperature source Oral, resp. rate 16, height 5\' 6"  (1.676 m), weight 56.2 kg (124 lb), last menstrual period 02/09/2016.  Physical Exam  Constitutional: She is oriented to person, place, and time. She appears well-developed and well-nourished.  HENT:  Head:  Normocephalic.  Neck: Normal range of motion.  Respiratory: Effort normal and breath sounds normal.  GI: Soft.  Neurological: She is alert and oriented to person, place, and time.  Skin: Skin is warm and dry.  Psychiatric: She has a normal mood and affect.    MAU Course  Procedures  MDM -UA -CMET-normal, no evidence of electrolyte imbalance.  -IV fluids and IV phenergan Patient feels better after IV fluids and IV phenergan. Ate crackers and juice and did not throw up.   Assessment and Plan   1. Hyperemesis affecting pregnancy, antepartum    2. D/C patient home with instructions to keep taking her phenergan suppository at night daily. Recommended small meals and high protein meals in order to stabilize blood sugar and avoid mild hypoglycemia leading to nausea.  3. Discussed plan of care with Dr. Claiborne Billingsallahan, who agrees.   Charlesetta GaribaldiKathryn Lorraine Corrissa Martello CNM 04/07/2016, 11:45 AM

## 2016-04-07 NOTE — MAU Note (Signed)
Pt C/O nausea & vomiting, unable to keep fluids down, has been an issue for the past 4 weeks.  Was given prescriptions when here previously in MAU - the two oral meds are not working, unable to keep them down.  States phenergan suppositories are working but can only take them at night because they make her sleepy.  Has some abdominal cramping,  no vaginal bleeding.

## 2016-04-07 NOTE — Discharge Instructions (Signed)

## 2016-04-25 ENCOUNTER — Inpatient Hospital Stay (HOSPITAL_COMMUNITY)
Admission: AD | Admit: 2016-04-25 | Discharge: 2016-04-25 | Disposition: A | Discharge status: Home / Self Care | Payer: PRIVATE HEALTH INSURANCE | Source: Ambulatory Visit | Attending: Obstetrics and Gynecology | Admitting: Obstetrics and Gynecology

## 2016-04-25 ENCOUNTER — Encounter (HOSPITAL_COMMUNITY): Payer: Self-pay

## 2016-04-25 DIAGNOSIS — Z880 Allergy status to penicillin: Secondary | ICD-10-CM | POA: Insufficient documentation

## 2016-04-25 DIAGNOSIS — Z9889 Other specified postprocedural states: Secondary | ICD-10-CM | POA: Diagnosis not present

## 2016-04-25 DIAGNOSIS — Z3A10 10 weeks gestation of pregnancy: Secondary | ICD-10-CM | POA: Insufficient documentation

## 2016-04-25 DIAGNOSIS — O210 Mild hyperemesis gravidarum: Secondary | ICD-10-CM | POA: Diagnosis not present

## 2016-04-25 LAB — COMPREHENSIVE METABOLIC PANEL
ALBUMIN: 3.9 g/dL (ref 3.5–5.0)
ALT: 13 U/L — ABNORMAL LOW (ref 14–54)
AST: 15 U/L (ref 15–41)
Alkaline Phosphatase: 26 U/L — ABNORMAL LOW (ref 38–126)
Anion gap: 9 (ref 5–15)
BILIRUBIN TOTAL: 0.6 mg/dL (ref 0.3–1.2)
BUN: 9 mg/dL (ref 6–20)
CO2: 21 mmol/L — AB (ref 22–32)
Calcium: 9.3 mg/dL (ref 8.9–10.3)
Chloride: 105 mmol/L (ref 101–111)
Creatinine, Ser: 0.67 mg/dL (ref 0.44–1.00)
GFR calc Af Amer: >60 mL/min (ref >60)
GFR calc non Af Amer: >60 mL/min (ref >60)
GLUCOSE: 78 mg/dL (ref 65–99)
POTASSIUM: 3.9 mmol/L (ref 3.5–5.1)
Sodium: 135 mmol/L (ref 135–145)
TOTAL PROTEIN: 6.9 g/dL (ref 6.5–8.1)

## 2016-04-25 LAB — URINALYSIS, ROUTINE W REFLEX MICROSCOPIC
Bilirubin Urine: NEGATIVE
Glucose, UA: NEGATIVE mg/dL
Ketones, ur: 80 mg/dL — AB
Nitrite: NEGATIVE
PROTEIN: 30 mg/dL — AB
Specific Gravity, Urine: 1.02 (ref 1.005–1.030)
pH: 5 (ref 5.0–8.0)

## 2016-04-25 LAB — CBC
HEMATOCRIT: 31.2 % — AB (ref 36.0–46.0)
HEMOGLOBIN: 11.2 g/dL — AB (ref 12.0–15.0)
MCH: 26.9 pg (ref 26.0–34.0)
MCHC: 35.9 g/dL (ref 30.0–36.0)
MCV: 75 fL — ABNORMAL LOW (ref 78.0–100.0)
Platelets: 193 10*3/uL (ref 150–400)
RBC: 4.16 MIL/uL (ref 3.87–5.11)
RDW: 13.2 % (ref 11.5–15.5)
WBC: 8.8 10*3/uL (ref 4.0–10.5)

## 2016-04-25 MED ORDER — DEXTROSE 5 % IN LACTATED RINGERS IV BOLUS
1000.0000 mL | Freq: Once | INTRAVENOUS | Status: AC
  Administered 2016-04-25: 1000 mL via INTRAVENOUS

## 2016-04-25 MED ORDER — PROMETHAZINE HCL 25 MG/ML IJ SOLN
25.0000 mg | Freq: Once | INTRAMUSCULAR | Status: AC
  Administered 2016-04-25: 25 mg via INTRAVENOUS
  Filled 2016-04-25: qty 1

## 2016-04-25 MED ORDER — LACTATED RINGERS IV BOLUS (SEPSIS)
1000.0000 mL | Freq: Once | INTRAVENOUS | Status: AC
  Administered 2016-04-25: 1000 mL via INTRAVENOUS

## 2016-04-25 NOTE — MAU Provider Note (Signed)
Chief Complaint: Hyperemesis Gravidarum   First Provider Initiated Contact with Patient 04/25/16 2043      SUBJECTIVE HPI: Shannon Thomas is a 26 y.o. G1P0 at [redacted]w[redacted]d by LMP who presents to maternity admissions reporting nausea with vomiting 7-8 times in 24 hours, not keeping any food or fluids down today. She was seen in the office today and her doctor wanted to admit her to the hospital but she did not want to come and wanted to try to manage it at home instead. She was prescribed a new regimen of Phenergan suppositories, Zofran PO and Zantac PO round the clock. Today, she has only taken the Zofran at 6 am.  She was out of Phenergan since Wednesday but picked up the Rx today.  This is a recurrent problem in the pregnancy. It is associated with weight loss of 11 lbs since onset of pregnancy.  She has tried Phenergan, Zofran, Zantac and some OTC medications and diet changes but nothing is helping.  The nausea is constant and nothing makes it better, food or fluids makes it worse. She denies abdominal pain, vaginal bleeding, vaginal itching/burning, urinary symptoms, h/a, dizziness, or fever/chills.     HPI  History reviewed. No pertinent past medical history. Past Surgical History:  Procedure Laterality Date  . APPENDECTOMY     Social History   Social History  . Marital status: Married    Spouse name: N/A  . Number of children: N/A  . Years of education: N/A   Occupational History  . Not on file.   Social History Main Topics  . Smoking status: Never Smoker  . Smokeless tobacco: Never Used  . Alcohol use No  . Drug use: No  . Sexual activity: Yes    Birth control/ protection: None   Other Topics Concern  . Not on file   Social History Narrative  . No narrative on file   No current facility-administered medications on file prior to encounter.    Current Outpatient Prescriptions on File Prior to Encounter  Medication Sig Dispense Refill  . ondansetron (ZOFRAN ODT) 4 MG  disintegrating tablet Take 1 tablet (4 mg total) by mouth every 8 (eight) hours as needed for nausea or vomiting. 20 tablet 0  . promethazine (PHENERGAN) 25 MG suppository Place 1 suppository (25 mg total) rectally every 6 (six) hours as needed for nausea or vomiting. 12 each 0  . ranitidine (ZANTAC) 150 MG tablet Take 1 tablet (150 mg total) by mouth 2 (two) times daily. (Patient not taking: Reported on 04/07/2016) 60 tablet 0   Allergies  Allergen Reactions  . Amoxicillin Hives    Has patient had a PCN reaction causing immediate rash, facial/tongue/throat swelling, SOB or lightheadedness with hypotension: Yes Has patient had a PCN reaction causing severe rash involving mucus membranes or skin necrosis: Yes Has patient had a PCN reaction that required hospitalization No Has patient had a PCN reaction occurring within the last 10 years: No If all of the above answers are "NO", then may proceed with Cephalosporin use.   Marland Kitchen Penicillins Hives    Has patient had a PCN reaction causing immediate rash, facial/tongue/throat swelling, SOB or lightheadedness with hypotension: Yes Has patient had a PCN reaction causing severe rash involving mucus membranes or skin necrosis: Yes Has patient had a PCN reaction that required hospitalization No Has patient had a PCN reaction occurring within the last 10 years: No If all of the above answers are "NO", then may proceed with Cephalosporin use.  ROS:  Review of Systems  Constitutional: Negative for chills, fatigue and fever.  Respiratory: Negative for shortness of breath.   Cardiovascular: Negative for chest pain.  Gastrointestinal: Positive for nausea and vomiting.  Genitourinary: Negative for difficulty urinating, dysuria, flank pain, pelvic pain, vaginal bleeding, vaginal discharge and vaginal pain.  Neurological: Negative for dizziness and headaches.  Psychiatric/Behavioral: Negative.      I have reviewed patient's Past Medical Hx, Surgical Hx,  Family Hx, Social Hx, medications and allergies.   Physical Exam   Patient Vitals for the past 24 hrs:  BP Temp Pulse Resp Height Weight  04/25/16 2005 120/75 97.8 F (36.6 C) 108 18 5\' 6"  (1.676 m) 121 lb 3.2 oz (55 kg)   Constitutional: Well-developed, well-nourished female in no acute distress.  Cardiovascular: normal rate Respiratory: normal effort GI: Abd soft, non-tender. Pos BS x 4 MS: Extremities nontender, no edema, normal ROM Neurologic: Alert and oriented x 4.  GU: Neg CVAT.   LAB RESULTS Results for orders placed or performed during the hospital encounter of 04/25/16 (from the past 24 hour(s))  Urinalysis, Routine w reflex microscopic     Status: Abnormal   Collection Time: 04/25/16  8:10 PM  Result Value Ref Range   Color, Urine YELLOW YELLOW   APPearance HAZY (A) CLEAR   Specific Gravity, Urine 1.020 1.005 - 1.030   pH 5.0 5.0 - 8.0   Glucose, UA NEGATIVE NEGATIVE mg/dL   Hgb urine dipstick SMALL (A) NEGATIVE   Bilirubin Urine NEGATIVE NEGATIVE   Ketones, ur 80 (A) NEGATIVE mg/dL   Protein, ur 30 (A) NEGATIVE mg/dL   Nitrite NEGATIVE NEGATIVE   Leukocytes, UA MODERATE (A) NEGATIVE   RBC / HPF 0-5 0 - 5 RBC/hpf   WBC, UA 0-5 0 - 5 WBC/hpf   Bacteria, UA RARE (A) NONE SEEN   Squamous Epithelial / LPF 0-5 (A) NONE SEEN   Mucous PRESENT   CBC     Status: Abnormal   Collection Time: 04/25/16  8:51 PM  Result Value Ref Range   WBC 8.8 4.0 - 10.5 K/uL   RBC 4.16 3.87 - 5.11 MIL/uL   Hemoglobin 11.2 (L) 12.0 - 15.0 g/dL   HCT 16.1 (L) 09.6 - 04.5 %   MCV 75.0 (L) 78.0 - 100.0 fL   MCH 26.9 26.0 - 34.0 pg   MCHC 35.9 30.0 - 36.0 g/dL   RDW 40.9 81.1 - 91.4 %   Platelets 193 150 - 400 K/uL  Comprehensive metabolic panel     Status: Abnormal   Collection Time: 04/25/16  8:51 PM  Result Value Ref Range   Sodium 135 135 - 145 mmol/L   Potassium 3.9 3.5 - 5.1 mmol/L   Chloride 105 101 - 111 mmol/L   CO2 21 (L) 22 - 32 mmol/L   Glucose, Bld 78 65 - 99 mg/dL    BUN 9 6 - 20 mg/dL   Creatinine, Ser 7.82 0.44 - 1.00 mg/dL   Calcium 9.3 8.9 - 95.6 mg/dL   Total Protein 6.9 6.5 - 8.1 g/dL   Albumin 3.9 3.5 - 5.0 g/dL   AST 15 15 - 41 U/L   ALT 13 (L) 14 - 54 U/L   Alkaline Phosphatase 26 (L) 38 - 126 U/L   Total Bilirubin 0.6 0.3 - 1.2 mg/dL   GFR calc non Af Amer >60 >60 mL/min   GFR calc Af Amer >60 >60 mL/min   Anion gap 9 5 - 15  IMAGING No results found.  MAU Management/MDM: Ordered labs and reviewed results. D5LR x 1000 ml, LR x 1000 ml and Phenergan 25 mg IV. Pt reports nausea completely resolved after medication. Discussed options with her since her provider wanted to admit her earlier today. Pt desires to go home and try regimen started by her doctor today.  Reviewed reasons to return or follow up in office.  Consult Dr Dareen PianoAnderson.  D/C home. Pt stable at time of discharge.  ASSESSMENT 1. Hyperemesis affecting pregnancy, antepartum     PLAN Discharge home  Allergies as of 04/25/2016      Reactions   Amoxicillin Hives   Has patient had a PCN reaction causing immediate rash, facial/tongue/throat swelling, SOB or lightheadedness with hypotension: Yes Has patient had a PCN reaction causing severe rash involving mucus membranes or skin necrosis: Yes Has patient had a PCN reaction that required hospitalization No Has patient had a PCN reaction occurring within the last 10 years: No If all of the above answers are "NO", then may proceed with Cephalosporin use.   Penicillins Hives   Has patient had a PCN reaction causing immediate rash, facial/tongue/throat swelling, SOB or lightheadedness with hypotension: Yes Has patient had a PCN reaction causing severe rash involving mucus membranes or skin necrosis: Yes Has patient had a PCN reaction that required hospitalization No Has patient had a PCN reaction occurring within the last 10 years: No If all of the above answers are "NO", then may proceed with Cephalosporin use.       Medication List    TAKE these medications   ondansetron 4 MG disintegrating tablet Commonly known as:  ZOFRAN ODT Take 1 tablet (4 mg total) by mouth every 8 (eight) hours as needed for nausea or vomiting.   promethazine 25 MG suppository Commonly known as:  PHENERGAN Place 1 suppository (25 mg total) rectally every 6 (six) hours as needed for nausea or vomiting.   ranitidine 150 MG tablet Commonly known as:  ZANTAC Take 1 tablet (150 mg total) by mouth 2 (two) times daily.      Follow-up Information    [facility]   Why:  As scheduled. Return to MAU as needed for emergencies. Contact information: [address] [place] [id number] [phone]           Sharen CounterLisa Leftwich-Kirby Certified Nurse-Midwife 04/25/2016  10:50 PM

## 2016-04-25 NOTE — MAU Note (Signed)
Saw doctor today and have hyperemesis. Trying different meds to help with nausea. MD wanted to directly admit her for flds and meds but pt wanted to try and manage at home but just cannot do it. Unable to keep down anything today.

## 2016-04-27 LAB — URINE CULTURE

## 2016-05-07 LAB — OB RESULTS CONSOLE ABO/RH: RH Type: POSITIVE

## 2016-05-07 LAB — OB RESULTS CONSOLE ANTIBODY SCREEN: Antibody Screen: NEGATIVE

## 2016-05-07 LAB — OB RESULTS CONSOLE HIV ANTIBODY (ROUTINE TESTING): HIV: NONREACTIVE

## 2016-05-07 LAB — OB RESULTS CONSOLE RPR: RPR: NONREACTIVE

## 2016-05-07 LAB — OB RESULTS CONSOLE RUBELLA ANTIBODY, IGM: Rubella: IMMUNE

## 2016-05-07 LAB — OB RESULTS CONSOLE HEPATITIS B SURFACE ANTIGEN: HEP B S AG: NEGATIVE

## 2016-10-06 ENCOUNTER — Encounter (HOSPITAL_COMMUNITY): Payer: Self-pay | Admitting: *Deleted

## 2016-10-06 ENCOUNTER — Inpatient Hospital Stay (HOSPITAL_COMMUNITY)
Admission: AD | Admit: 2016-10-06 | Discharge: 2016-10-06 | Disposition: A | Discharge status: Home / Self Care | Payer: PRIVATE HEALTH INSURANCE | Source: Ambulatory Visit | Attending: Obstetrics and Gynecology | Admitting: Obstetrics and Gynecology

## 2016-10-06 DIAGNOSIS — O479 False labor, unspecified: Secondary | ICD-10-CM

## 2016-10-06 DIAGNOSIS — O4703 False labor before 37 completed weeks of gestation, third trimester: Secondary | ICD-10-CM | POA: Insufficient documentation

## 2016-10-06 DIAGNOSIS — Z3A34 34 weeks gestation of pregnancy: Secondary | ICD-10-CM | POA: Diagnosis not present

## 2016-10-06 LAB — URINALYSIS, ROUTINE W REFLEX MICROSCOPIC
BILIRUBIN URINE: NEGATIVE
Glucose, UA: NEGATIVE mg/dL
HGB URINE DIPSTICK: NEGATIVE
Ketones, ur: NEGATIVE mg/dL
NITRITE: NEGATIVE
Protein, ur: NEGATIVE mg/dL
SPECIFIC GRAVITY, URINE: 1.01 (ref 1.005–1.030)
pH: 6 (ref 5.0–8.0)

## 2016-10-06 NOTE — MAU Note (Signed)
Pt presents to MAU with complaints of abdominal pain that is constant that started last night and has gotten worse throughout the day.Pt denies any VB or LOF.

## 2016-10-06 NOTE — Discharge Instructions (Signed)

## 2016-10-06 NOTE — MAU Provider Note (Signed)
CC:  Chief Complaint  Patient presents with  . Contractions     First Provider Initiated Contact with Patient 10/06/16 1640      HPI: Shannon Thomas is a 26 y.o. year old G1P0 female at 2946w2d weeks gestation who presents to MAU reporting increased, irreg contractions since last night and feeling like her abd stayed tight throughout the day today. No issues w/ PTL this pregnancy.  Associated Sx:  Vaginal bleeding: None Leaking of fluid: None Fetal movement: Nml  O: Patient Vitals for the past 24 hrs:  BP Temp Pulse Resp SpO2  10/06/16 1620 117/76 97.7 F (36.5 C) 94 18 100 %    General: NAD Heart: Regular rate Lungs: Normal rate and effort Abd: Soft, NT, Gravid, S=D Pelvic: NEFG, No blood.  Dilation: Closed Effacement (%): Thick Cervical Position: Posterior Exam by:: V [name] CNM  EFM: 130, Moderate variability, 15 x 15 accelerations, no decelerations Toco: Contractions rare, w/ UI  Results for orders placed or performed during the hospital encounter of 10/06/16 (from the past 24 hour(s))  Urinalysis, Routine w reflex microscopic     Status: Abnormal   Collection Time: 10/06/16  4:14 PM  Result Value Ref Range   Color, Urine YELLOW YELLOW   APPearance HAZY (A) CLEAR   Specific Gravity, Urine 1.010 1.005 - 1.030   pH 6.0 5.0 - 8.0   Glucose, UA NEGATIVE NEGATIVE mg/dL   Hgb urine dipstick NEGATIVE NEGATIVE   Bilirubin Urine NEGATIVE NEGATIVE   Ketones, ur NEGATIVE NEGATIVE mg/dL   Protein, ur NEGATIVE NEGATIVE mg/dL   Nitrite NEGATIVE NEGATIVE   Leukocytes, UA TRACE (A) NEGATIVE   RBC / HPF 0-5 0 - 5 RBC/hpf   WBC, UA 6-30 0 - 5 WBC/hpf   Bacteria, UA MANY (A) NONE SEEN   Squamous Epithelial / LPF 6-30 (A) NONE SEEN   Mucous PRESENT     A: 8346w2d week IUP Braxton Hicks FHR reactive  P: Discharge home in stable condition per consult with Waynard Reedsoss, Kendra, MD. Preterm labor precautions and fetal kick counts. Follow-up as scheduled for prenatal visit or sooner as  needed if symptoms worsen. Return to maternity admissions as needed if symptoms worsen.  [place] Shakil Dirk, CNM 03/24/2016 11:40 PM  3

## 2016-10-15 LAB — OB RESULTS CONSOLE GBS: STREP GROUP B AG: POSITIVE

## 2016-11-13 ENCOUNTER — Inpatient Hospital Stay (HOSPITAL_COMMUNITY)
Admission: AD | Admit: 2016-11-13 | Discharge: 2016-11-14 | Disposition: A | Discharge status: Home / Self Care | Payer: PRIVATE HEALTH INSURANCE | Source: Ambulatory Visit | Attending: Obstetrics | Admitting: Obstetrics

## 2016-11-13 ENCOUNTER — Encounter (HOSPITAL_COMMUNITY): Payer: Self-pay | Admitting: *Deleted

## 2016-11-13 DIAGNOSIS — O471 False labor at or after 37 completed weeks of gestation: Secondary | ICD-10-CM

## 2016-11-14 ENCOUNTER — Inpatient Hospital Stay (HOSPITAL_COMMUNITY): Payer: PRIVATE HEALTH INSURANCE | Admitting: Anesthesiology

## 2016-11-14 ENCOUNTER — Encounter (HOSPITAL_COMMUNITY): Payer: Self-pay

## 2016-11-14 ENCOUNTER — Inpatient Hospital Stay (HOSPITAL_COMMUNITY)
Admission: AD | Admit: 2016-11-14 | Discharge: 2016-11-15 | DRG: 775 | Disposition: A | Discharge status: Home / Self Care | Payer: PRIVATE HEALTH INSURANCE | Source: Ambulatory Visit | Attending: Obstetrics and Gynecology | Admitting: Obstetrics and Gynecology

## 2016-11-14 DIAGNOSIS — O99824 Streptococcus B carrier state complicating childbirth: Secondary | ICD-10-CM | POA: Diagnosis present

## 2016-11-14 DIAGNOSIS — Z3A39 39 weeks gestation of pregnancy: Secondary | ICD-10-CM | POA: Diagnosis not present

## 2016-11-14 DIAGNOSIS — Z3490 Encounter for supervision of normal pregnancy, unspecified, unspecified trimester: Secondary | ICD-10-CM

## 2016-11-14 DIAGNOSIS — Z880 Allergy status to penicillin: Secondary | ICD-10-CM

## 2016-11-14 DIAGNOSIS — Z3493 Encounter for supervision of normal pregnancy, unspecified, third trimester: Secondary | ICD-10-CM | POA: Diagnosis present

## 2016-11-14 LAB — CBC
HEMATOCRIT: 31.6 % — AB (ref 36.0–46.0)
HEMOGLOBIN: 10.9 g/dL — AB (ref 12.0–15.0)
MCH: 27.4 pg (ref 26.0–34.0)
MCHC: 34.5 g/dL (ref 30.0–36.0)
MCV: 79.4 fL (ref 78.0–100.0)
Platelets: 256 10*3/uL (ref 150–400)
RBC: 3.98 MIL/uL (ref 3.87–5.11)
RDW: 15.6 % — AB (ref 11.5–15.5)
WBC: 16.2 10*3/uL — AB (ref 4.0–10.5)

## 2016-11-14 LAB — ABO/RH: ABO/RH(D): AB POS

## 2016-11-14 LAB — TYPE AND SCREEN
ABO/RH(D): AB POS
Antibody Screen: NEGATIVE

## 2016-11-14 MED ORDER — OXYCODONE-ACETAMINOPHEN 5-325 MG PO TABS: 1.0000 | ORAL_TABLET | ORAL | Status: DC | PRN

## 2016-11-14 MED ORDER — FENTANYL CITRATE (PF) 100 MCG/2ML IJ SOLN: 50.0000 ug | INTRAMUSCULAR | Status: DC | PRN

## 2016-11-14 MED ORDER — LACTATED RINGERS IV SOLN: 500.0000 mL | INTRAVENOUS | Status: DC | PRN

## 2016-11-14 MED ORDER — OXYCODONE-ACETAMINOPHEN 5-325 MG PO TABS: 2.0000 | ORAL_TABLET | ORAL | Status: DC | PRN

## 2016-11-14 MED ORDER — LACTATED RINGERS IV SOLN: INTRAVENOUS | Status: DC

## 2016-11-14 MED ORDER — TERBUTALINE SULFATE 1 MG/ML IJ SOLN
0.2500 mg | Freq: Once | INTRAMUSCULAR | Status: DC | PRN
  Filled 2016-11-14: qty 1

## 2016-11-14 MED ORDER — SIMETHICONE 80 MG PO CHEW: 80.0000 mg | CHEWABLE_TABLET | ORAL | Status: DC | PRN

## 2016-11-14 MED ORDER — IBUPROFEN 600 MG PO TABS: 600.0000 mg | ORAL_TABLET | Freq: Four times a day (QID) | ORAL | Status: DC

## 2016-11-14 MED ORDER — PRENATAL MULTIVITAMIN CH
1.0000 | ORAL_TABLET | Freq: Every day | ORAL | Status: DC
  Filled 2016-11-14: qty 1

## 2016-11-14 MED ORDER — ZOLPIDEM TARTRATE 5 MG PO TABS: 5.0000 mg | ORAL_TABLET | Freq: Every evening | ORAL | Status: DC | PRN

## 2016-11-14 MED ORDER — WITCH HAZEL-GLYCERIN EX PADS: 1.0000 "application " | MEDICATED_PAD | CUTANEOUS | Status: DC | PRN

## 2016-11-14 MED ORDER — BENZOCAINE-MENTHOL 20-0.5 % EX AERO: 1.0000 "application " | INHALATION_SPRAY | CUTANEOUS | Status: DC | PRN

## 2016-11-14 MED ORDER — SENNOSIDES-DOCUSATE SODIUM 8.6-50 MG PO TABS
2.0000 | ORAL_TABLET | ORAL | Status: DC
  Administered 2016-11-15: 2 via ORAL
  Filled 2016-11-14: qty 2

## 2016-11-14 MED ORDER — OXYCODONE-ACETAMINOPHEN 5-325 MG PO TABS: 1.0000 | ORAL_TABLET | ORAL | 0 refills | Status: DC | PRN

## 2016-11-14 MED ORDER — EPHEDRINE 5 MG/ML INJ
10.0000 mg | INTRAVENOUS | Status: DC | PRN
  Filled 2016-11-14: qty 2

## 2016-11-14 MED ORDER — IBUPROFEN 600 MG PO TABS
600.0000 mg | ORAL_TABLET | Freq: Four times a day (QID) | ORAL | Status: DC
  Filled 2016-11-14: qty 1

## 2016-11-14 MED ORDER — DIBUCAINE 1 % RE OINT: 1.0000 "application " | TOPICAL_OINTMENT | RECTAL | Status: DC | PRN

## 2016-11-14 MED ORDER — ACETAMINOPHEN 325 MG PO TABS: 650.0000 mg | ORAL_TABLET | ORAL | Status: DC | PRN

## 2016-11-14 MED ORDER — PHENYLEPHRINE 40 MCG/ML (10ML) SYRINGE FOR IV PUSH (FOR BLOOD PRESSURE SUPPORT)
80.0000 ug | PREFILLED_SYRINGE | INTRAVENOUS | Status: DC | PRN
  Filled 2016-11-14: qty 5

## 2016-11-14 MED ORDER — LIDOCAINE HCL (PF) 1 % IJ SOLN
INTRAMUSCULAR | Status: DC | PRN
  Administered 2016-11-14 (×2): 6 mL via EPIDURAL

## 2016-11-14 MED ORDER — BENZOCAINE-MENTHOL 20-0.5 % EX AERO
1.0000 "application " | INHALATION_SPRAY | CUTANEOUS | Status: DC | PRN
  Filled 2016-11-14: qty 56

## 2016-11-14 MED ORDER — OXYTOCIN 40 UNITS IN LACTATED RINGERS INFUSION - SIMPLE MED
1.0000 m[IU]/min | INTRAVENOUS | Status: DC
  Administered 2016-11-14: 2 m[IU]/min via INTRAVENOUS
  Filled 2016-11-14: qty 1000

## 2016-11-14 MED ORDER — LACTATED RINGERS IV SOLN: 500.0000 mL | Freq: Once | INTRAVENOUS | Status: DC

## 2016-11-14 MED ORDER — SODIUM BICARBONATE 8.4 % IV SOLN
INTRAVENOUS | Status: DC | PRN
  Administered 2016-11-14: 6 mL via EPIDURAL

## 2016-11-14 MED ORDER — IBUPROFEN 600 MG PO TABS: 600.0000 mg | ORAL_TABLET | Freq: Four times a day (QID) | ORAL | Status: DC | PRN

## 2016-11-14 MED ORDER — ONDANSETRON HCL 4 MG PO TABS: 4.0000 mg | ORAL_TABLET | ORAL | Status: DC | PRN

## 2016-11-14 MED ORDER — OXYCODONE-ACETAMINOPHEN 5-325 MG PO TABS
1.0000 | ORAL_TABLET | ORAL | Status: DC | PRN
Start: 2016-11-14 — End: 2016-11-14

## 2016-11-14 MED ORDER — MEASLES, MUMPS & RUBELLA VAC ~~LOC~~ INJ: 0.5000 mL | INJECTION | Freq: Once | SUBCUTANEOUS | Status: DC

## 2016-11-14 MED ORDER — ONDANSETRON HCL 4 MG/2ML IJ SOLN: 4.0000 mg | Freq: Four times a day (QID) | INTRAMUSCULAR | Status: DC | PRN

## 2016-11-14 MED ORDER — DIPHENHYDRAMINE HCL 50 MG/ML IJ SOLN: 12.5000 mg | INTRAMUSCULAR | Status: DC | PRN

## 2016-11-14 MED ORDER — VANCOMYCIN HCL IN DEXTROSE 1-5 GM/200ML-% IV SOLN
1000.0000 mg | Freq: Two times a day (BID) | INTRAVENOUS | Status: DC
  Administered 2016-11-14: 1000 mg via INTRAVENOUS
  Filled 2016-11-14 (×2): qty 200

## 2016-11-14 MED ORDER — OXYTOCIN 40 UNITS IN LACTATED RINGERS INFUSION - SIMPLE MED: 2.5000 [IU]/h | INTRAVENOUS | Status: DC

## 2016-11-14 MED ORDER — OXYTOCIN BOLUS FROM INFUSION: 500.0000 mL | Freq: Once | INTRAVENOUS | Status: DC

## 2016-11-14 MED ORDER — PHENYLEPHRINE 40 MCG/ML (10ML) SYRINGE FOR IV PUSH (FOR BLOOD PRESSURE SUPPORT)
80.0000 ug | PREFILLED_SYRINGE | INTRAVENOUS | Status: DC | PRN
  Filled 2016-11-14: qty 10
  Filled 2016-11-14: qty 5

## 2016-11-14 MED ORDER — TETANUS-DIPHTH-ACELL PERTUSSIS 5-2.5-18.5 LF-MCG/0.5 IM SUSP: 0.5000 mL | Freq: Once | INTRAMUSCULAR | Status: DC

## 2016-11-14 MED ORDER — LACTATED RINGERS IV SOLN
INTRAVENOUS | Status: DC
  Administered 2016-11-14: 150 mL/h via INTRAUTERINE

## 2016-11-14 MED ORDER — COCONUT OIL OIL: 1.0000 "application " | TOPICAL_OIL | Status: DC | PRN

## 2016-11-14 MED ORDER — FENTANYL 2.5 MCG/ML BUPIVACAINE 1/10 % EPIDURAL INFUSION (WH - ANES)
14.0000 mL/h | INTRAMUSCULAR | Status: DC | PRN
  Administered 2016-11-14 (×2): 14 mL/h via EPIDURAL
  Filled 2016-11-14: qty 100

## 2016-11-14 MED ORDER — LACTATED RINGERS IV SOLN
500.0000 mL | Freq: Once | INTRAVENOUS | Status: AC
  Administered 2016-11-14: 500 mL via INTRAVENOUS

## 2016-11-14 MED ORDER — ONDANSETRON HCL 4 MG/2ML IJ SOLN: 4.0000 mg | INTRAMUSCULAR | Status: DC | PRN

## 2016-11-14 NOTE — Anesthesia Preprocedure Evaluation (Addendum)
Anesthesia Evaluation  Patient identified by MRN, date of birth, ID band Patient awake    Reviewed: Allergy & Precautions, NPO status , Patient's Chart, lab work & pertinent test results  History of Anesthesia Complications Negative for: history of anesthetic complications  Airway Mallampati: I  TM Distance: >3 FB Neck ROM: Full    Dental  (+) Dental Advisory Given   Pulmonary neg pulmonary ROS,    breath sounds clear to auscultation       Cardiovascular negative cardio ROS   Rhythm:Regular Rate:Normal     Neuro/Psych negative neurological ROS     GI/Hepatic Neg liver ROS, GERD  ,  Endo/Other  negative endocrine ROS  Renal/GU negative Renal ROS     Musculoskeletal   Abdominal   Peds  Hematology plt 256k   Anesthesia Other Findings   Reproductive/Obstetrics (+) Pregnancy                            Anesthesia Physical Anesthesia Plan  ASA: I  Anesthesia Plan: Epidural   Post-op Pain Management:    Induction:   PONV Risk Score and Plan: Treatment may vary due to age or medical condition  Airway Management Planned: Natural Airway  Additional Equipment:   Intra-op Plan:   Post-operative Plan:   Informed Consent: I have reviewed the patients History and Physical, chart, labs and discussed the procedure including the risks, benefits and alternatives for the proposed anesthesia with the patient or authorized representative who has indicated his/her understanding and acceptance.   Dental advisory given  Plan Discussed with:   Anesthesia Plan Comments: (Patient identified. Risks/Benefits/Options discussed with patient including but not limited to bleeding, infection, nerve damage, paralysis, failed block, incomplete pain control, headache, blood pressure changes, nausea, vomiting, reactions to medication both or allergic, itching and postpartum back pain. Confirmed with bedside  nurse the patient's most recent platelet count. Confirmed with patient that they are not currently taking any anticoagulation, have any bleeding history or any family history of bleeding disorders. Patient expressed understanding and wished to proceed. All questions were answered. )       Anesthesia Quick Evaluation

## 2016-11-14 NOTE — MAU Note (Signed)
I have communicated with Dr. Chestine Spore and reviewed vital signs:  Vitals:   11/13/16 2342  BP: 117/80  Pulse: 95  Resp: 18  Temp: 97.6 F (36.4 C)    Vaginal exam:  Dilation: 1 Effacement (%): 70 Cervical Position: Posterior Station: -1 Presentation: Vertex Exam by:: A Trip Cavanagh RN,   Also reviewed contraction pattern and that non-stress test is reactive.  It has been documented that patient is contracting every 3-7 minutes with no cervical change over 1.5 hours not indicating active labor.  Patient denies any other complaints.  Based on this report provider has given order for discharge.  A discharge order and diagnosis entered by a provider.   Labor discharge instructions reviewed with patient.

## 2016-11-14 NOTE — MAU Note (Signed)
Pt has been having ctx since last night. Have gotten worse today and worsening in her back. No bleeding or LOF.

## 2016-11-14 NOTE — H&P (Signed)
Pt is a 26 y/o white female G1P0 at term who presents to L&D in labor.PNC was uncomplicated. Pt is GBS+ allergic to PCN/ Pt had an amniotomy with moderate mec. 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 I AB. S ECTOPICS MULTIPLE LIVING 1         Gyn History Date of LMP: 02/09/2016. Age at Menarche:: 88. Date of Last Pap Smear: 03/31/2013 (Notes: WNL per pt report). HPV Vaccination: Completed. History of Abnormal PAP: N. Sexually Active: Y. Sexual Orientation: Heterosexual. History of Endometriosis: N. History of Sexually Transmitted Infection: N. History of Fibroids: N. History of Infertility: N. History of PCOS: N. History of Recurrent Ovarian Cysts: N. Post Menopausal Hormone Therapy User: Never. Current Birth Control Method: None.  Past Pregnancies None recorded.  Past Medical History Question O Neg.  + Pos. Notes Cancer- Breast O  Cancer- Cervical O  Cancer- Colon O  Cancer- Endometrial/Uterine O  Cancer- Genetic screening O  Cancer- Lung O  Cancer- Ovary O  Cancer- Skin O  Cancer- Vaginal O  Cancer- Vulvar O  Cancer-Other O  Cardiac-Aneurysm O  Cardiology- Atrial fib/atrial flutter   Cardiology- Heart Arrhythmia O  Cardiology- Heart Attack   Cardiology- Heart Disease O  Cardiology- Heart Murmur/Mitral Valve Prolapse   Cardiology- High Blood  Pressure O  Cardiology- High Cholesterol O  Cardiology-Other O  Dermatology-Acne O  Dermatology-Eczema/Psoriasis O  Dermatology-Other O  ENT- Hearing Loss O  ENT- Seasonal Allergies/Allergic Rhinitis   ENT-Other O  Endocrinology- Diabetes O  Endocrinology- Diabetes O  Endocrinology- Elevated Prolactin O  Endocrinology- Glucose Intolerance/Insulin Resistance   Endocrinology- History of Gestational Diabetes   Endocrinology- Hyperthyroidism   Endocrinology- Hypothyroidism   Endocrinology- Osteopenia O  Endocrinology- Osteoporosis   Endocrinology- Prolactinoma   Endocrinology- Thyroid Problems O  Endocrinology- Vitamin Deficiency   Endocrinology-Other O  Eyes- Glaucoma   Eyes- Vision Loss/Macular Degeneration O  Eyes-other   GI- Colon Polyps O  GI- Crohn's/Ulcerative Colitis O  GI- Gallbladder Disease O  GI- Hemorrhoids O  GI- Irritable Bowel Syndrome O  GI- Liver Disease/Hepatitis O  GI- Reflux/Ulcers O  GI- Vitamin Deficiency O  GI-Other O  GYN- Dysplasia   GYN- Endometriosis   GYN- Fibroids   GYN- Infertility   GYN- PCOS   GYN-Other   Hematology- Anemia O  Question O Neg.  + Pos. Notes Hematology- Bleeding Disorder O  Hematology- Blood Clotting Disorder/Factor V Leiden O  Hematology- Blood Transfusion O  Hematology- DVT/Pulmonary Embolism O  Hematology-Other O  ID- Tuberculosis/Positive PPD O  ID- Chicken Pox/Shingles O  ID- HIV O  ID- MRSA   ID- Rheumatic Fever O  ID- Usual childhood diseases-Chicken Pox O  ID-Other O  Nephrology-Renal Disease   Neurology- Dementia   Neurology- Headaches/Migraines O  Neurology- Memory Loss/Dementia O  Neurology- Multiple Sclerosis   Neurology- Neuropathy O  Neurology- Seizures/Epilepsy O  Neurology- Stroke/TIA O  Neurology-Other O  Ortho- Arthritis   Ortho-Chronic Back Pain O  Ortho-Degenerative Joint Disease O  Ortho-Fractures O  Ortho-Other O  Psych- ADD O  Psych- Anxiety Disorder O  Psych- Bipolar  Disease O  Psych- Depression O  Psych- Eating Disorder O  Psych- Mental Disorder   Psych- PMS/PMDD O  Psych-Other O  Pulmonary- Asthma O  Pulmonary- COPD/Emphysema O  Pulmonary- Lung Disease   Pulmonary- Seasonal Allergies/Allergic Rhinitis + otc meds Pulmonary- Sleep Apnea O  Pulmonary-Other O  Rheumatology- Arthritis O  Rheumatology- Autoimmune Disease O  Rheumatology- Fibromyalgia/Chronic Pain O  Rheumatology- Restless Leg Syndrome O  Rheumatology-Other O  Urology- Frequent Urinary Tract Infections O  Urology- Hematuria (Blood in Urine) O  Urology- Interstitial Cystitis O  Urology- Kidney Disease O  Urology- Kidney Infection O  Urology- Kidney or Bladder Problems   Urology- Recurrent Urinary Tract Infections   Urology- Stones   Urology- Urinary Incontinence   Urology- Urinary Incontinence O  Urology-Other O  Vascular-Aneurysm   Weight Management/Obesity O  Pre-Pregnancy Pregnancy # Years Use Alcohol  None  Tobacco  N 0 Illicit/Recreational Drugs  0 0 Surgical History  GI- Appendectomy - 03/31/2012       Allergies PENICILLINS: Hives (Mild to moderate)   Is Blood Transfusion Acceptable in an Emergency? wishes not to answer at this time Is Anesthesia Consult Planned?   Pregnancy Problem List Problem Start Date End Date Resolution/Plan Note In Patient Problem List Group B streptococcus carrier complicating pregnancy    Clinda resistant N  Medication List clotrimazole-betamethasone 1 %-0.05 % topical cream APPLY TO THE AFFECTED AND SURROUNDING AREAS OF SKIN 3 TIMES A DAY AS NEEDED 09/17/16   filled surescripts iron 09/17/16   entered Dafina Konushevci Prenatal 06/23/16   entered Jodelle Red Zantac 05/07/16   entered Chari Manning  Menstrual History Last menstrual period 02/09/2016 + 40 weeks = 11/15/2016 Menses monthly  Frequency: Q  Menarche  Prior menses  On BCP at concept  hCG +   EDD Calculation Last menstrual period 02/09/2016 + 40  weeks = 11/15/2016 Initial exam date 03/28/2016 + 33 weeks 1 day = 11/15/2016 Initial ultrasound date 03/28/2016 + 33 weeks 1 day = 11/15/2016 Initial EDD 11/15/2016 by Liana Gerold on 04/02/2016  18-20-Week EDD Update Quickening Fundal Ht. at [place]. Ultrasound date 03/28/2016 + 33 weeks 1 day = 11/15/2016 Final EDD 11/15/2016 by Liana Gerold on 04/02/2016  Pre-pregnancy weight:  Prenatal Flowsheet Fundus Pres FHR FM PLS Cervix Exam BP Wt Edema Glucose Protein Albumin Ketones Nitrite Leukocytes Blood 04/25/2016   10 wks 6 days   [email or web address]                       Present    100/58 sitting L arm 119.8 lbs  none neg  negative Negative   Comments: Pt was undecided about genetic screening @ 1? visit, states she desires screening now - did not call office as advised at her 1? to change apt for NT scan. Diclegis did not help N/V - now doing phenergan suppositories which help sx. About 11lb weight loss since 1? C/o "I hurt all over" WNL pap/cultures and neg ucx. -AC Seen at MAU twice for fluids. Never f/u in office. Is taking phenergan 2x/day. Sleeping most of the day. Phenergan helps--is able to tolerate food 1hr after taking. Unable to take pills--makes her gag--so did not really try any other meds. UA w neg ketones. Refuses to go to [facility] for evaluation today. Plan: phenergan  suppository q6h, zantac BID and zofran ODT q8h--all scheduled. Will hide pills in food. If no improvement in 48h will present to MAU 05/07/2016   12 wks 4 days   [email or web address]                       Present    108/62 sitting L arm 129 lbs  none neg      Comments: NT scan today w/ FTS/PNL. 10lb weight gain since last visit. N/V much improved with scheduled phenergan suppository q6hr zantac BID and zofran q8hr. Requesting refills on suppositories(currently out after using last one this AM) and zofran(has #4 remaining) today RLP worsening. Staying hydrated with water. Denies VB -AC odt Didn't like zantac. Has decreased to zofran once daily and  phenergan 1-2x daily. Feeling MUCH improved. Refills sent. Miralax for constipation (BM 1x/week). NT 1.63mm 06/06/2016   16 wks 6 days   [email or web address]                       150    104/60 sitting L arm 133.8 lbs  none neg      Comments: FTS neg screen. Stopped zofran d/t HA. She is still taking phenergan once daily - still having at least one bad day of N/V but feels sx have much improved. Constipation resolved after disc zofran. -AC Overall doing great. No concerns. Declines AFP, anatomy scan next visit 06/23/2016   19 wks 2 days   [email or web address]                       108/66 sitting L arm 134 lbs  none neg      Comments: Pt presents today for anatomy scan. Pt is still having to take phenergan suppositories for nausea. Pt also states she has been having lower left abdominal cramping, usually notices at night. Also c/o having some "OFF" tasting in mouth postprandial or when waking up. Also is having some weird green d/c for about a week. +flutters. tg 07/25/2016   23 wks 6 days   [email or web address]                       140 Yes none  106/66 sitting R arm 139 lbs  none neg none negative Negative trace neg Comments: Pt c/o burning with urination x3 days, denies frequency, urgency, LBP. Afebrile. +FM. Pt c/o one episode of sharp LLQP on 07/27/2016 , lasted for 30 mins, denies recurrence./vk Urine for culture, will tx with result. 08/22/2016   27 wks 6 days   [email or web address]                       28 cm  145 Present Yes Other (see comments)  104/66 sitting L arm 144 lbs none none neg      Comments: Pt presents for 1HR GTT today. Pt complains of round ligament pain. Pt denies any LOF, VB +FM, desires TDAP today. Complains of decreased appetite/AE Discussed eating frequent small meals. Recommended prn Tylenol, stretching exercises, maternity belt, warm baths for RLP. WP 09/03/2016   29 wks 4 days   [email or web address]                       28  140  none  120/62 sitting R arm 146.6 lbs  none neg      Comments: [place] today for growth; 1 hr. GTT-pass; Pt.  c/o external vaginal itching, using vagisil which helps; also c/o RLP which is worse on left and in the afternoon; Pt. started taking iron daily; active FM/ tb//EFW/AFI 3#14, 77%ile, AFI 17, VTX. On iron. Prilosec for heartburn. 09/17/2016   31 wks 4 days   [email or web address]                       31 cm  Present Yes   110/64 sitting L arm 148.4 lbs none none neg      Comments: Pt here for routine ob visit. Pt states she is taking iron. Pt c/o external vaginal itching, has been using OTC vagisil cream- not working. +FM/dk Pt has a yeast inf. She will use Terzol 3. She has had an occ ctx. 10/02/2016   33 wks 5 days   [email or web address]                       33  120 Yes none  112/64 sitting L arm 150.8 lbs  none neg      Comments: Pt reports +FM. Completed terazol 3 for yeast - sx resolved without return. She is taking Iron with PNV daily - denies constipation. S/p TDAP -AC Many Qs on labor--discussed. Desires unmedicated delivery 10/15/2016   35 wks 4 days   [email or web address]                       35 Vertex 130 Yes  0cm / 50% / -2 114/72 sitting L arm 153 lbs  none neg      Comments: OB visit today with GBS with pcn allergy. Pt is having some BHs Cxs that come irregular, some were very uncomfortable, had visit @ [facility] last week on 10/06/2016. Pt denies LOF/bleeding. +FM. tg 10/20/2016   36 wks 2 days   [email or web address]                       36 Cephalic 130 Yes none 0cm / / 578/46 sitting R arm 153.8 lbs  none neg      Comments: GBS- results pending; Pt denies LOF or vaginal bleeding. c/o BH contractions. Good FM/vk 10/27/2016   37 wks 2 days   [email or web address]                       37 cm Vertex 125 Present Yes Other (see comments) 1cm / 40% / -2 102/70 sitting L arm 153 lbs none none neg      Comments: Pt presents for ROB. GBS Positive. Pt complains of extreme nausea this morning. Pt denies any LOF, VB, +FM/AE GBS resistant to Clindamycin. Labor precautions f/u 1 week WP 11/05/2016   38 wks 4 days   [email or web address]                        35 Vertex 130 Yes Braxton Hicks 1cm / 70% / -2 122/70 sitting R arm 158 lbs none none neg      Comments: GBS+; Pt. c/o irregular contractions and pink tinged spotting yesterday while wiping, no LOF, active FM/ tb// No SA, spotting yesterday. Pt is teary- just very uncomfortable. PCN allergy hives bad enough to get steroid shot- will need vancomycin for GBS/resistant to clinda. 11/11/2016   39 wks 3 days   [email or web address]  38 cm Vertex Present Yes  0cm / / -1 110/70 sitting L arm 156.4 lbs none none neg      Comments: Pt here for ob prob visit. C/o severe plevic pain & pressure. Also states she is having contractions every 15 minutes, each lasting about 15 minutes with occasional 10 minute breaks between the next contraction since 6 am this morning. +FM/dk Pt having irregular ctx. Good FM. She has a closed cx. REc tylenol Call with decreased FM, bleeding, ctxs q 3-5 min  Lab Results *Asterisk denotes an abnormal result   Result Value Ref. Range Date Collected Date Reviewed Reviewed By Note Initial Labs           Blood Type AB  05/07/2016 05/13/2016 [email or web address]      D (Rh) Type Positive  05/07/2016 05/13/2016 [email or web address]      Antibody Screen Negative  05/07/2016 05/13/2016 [email or web address]      HCT - Initial *30.9  05/07/2016 05/13/2016 [email or web address]      HGB - Initial *10.2  05/07/2016 05/13/2016 [email or web address]      MCV - Initial 80.3  05/07/2016 05/13/2016 [email or web address]      PLT - Initial 236  05/07/2016 05/13/2016 [email or web address]      VDRL - Initial Non-Reactive  05/07/2016 05/13/2016 [email or web address]      Urine Culture/Screen Negative  07/25/2016 07/28/2016 [email or web address]      HBsAg Negative  05/07/2016 05/13/2016 [email or web address]      HIV Counseling/Testing Non-Reactive  05/07/2016 05/13/2016 [email or web address]      Chlamydia - Initial Negative  03/28/2016 04/02/2016 [email or web address]      Gonorrhea - Initial Negative  03/28/2016 04/02/2016 [email or web address]      Varicella           Rubella Immune  05/07/2016 05/13/2016 [email or web address]  Optional  Labs           HGB Electrophoresis           PPD/Quanta           Pap Test Normal  03/28/2016 04/02/2016 [email or web address]      Cystic Fibrosis           HPV           Tay-Sachs           Familial Dysautonomia           Genetic Screening Tests           NST           TSH           Drug screen           HCV Ab           HCV RNA           Urinalysis           Rhogam Injection       8-20 Week Labs           Ultrasound - Initial Normal  06/23/2016 06/25/2016 [email or web address] Anatomy US: S=D, normal appearing fetal anatomy. Pt having gender reveal party thhis weekend. Gender not documented. Cvx closed/long 4.57 cm. FHR 145, Anterior placenta.     1st Trimester Aneuploidy Risk Assessment Negative  05/07/2016 05/13/2016 [email or web address]      MSAFP/Multiple Markers           2nd Trimester Serum Screening           Amnio/CVS           Karyotype           Amniotic  Fluid (AFP)       24-28 Week Labs           HCT - 24-28 Weeks 29.6  08/22/2016 08/26/2016 [email or web address]      HGB - 24-28 Weeks *9.9  08/22/2016 08/26/2016 [email or web address]      MCV - 24-28 Weeks 82.2  08/22/2016 08/26/2016 [email or web address]      PLT - 24-28 Weeks 249  08/22/2016 08/26/2016 [email or web address]      Diabetes Screen 60  08/22/2016 08/26/2016 [email or web address] Pass     GTT (If Screen Abnormal)           D (Rh) Antibody Screen       32-36 Week Labs           HCT - 32-36 Weeks           HGB - 32-36 Weeks           MCV - 32-36 Weeks           PLT - 32-36 Weeks           Ultrasound - 32-36 Weeks           HIV (When Indicated)           VDRL - 32-36 Weeks           Gonorrhea - 32-36 Weeks           Chlamydia - 32-36 Weeks           Depression screening (when indicated)       35-37 Week Labs           Group B Strep Positive  10/15/2016 10/21/2016 [email or web address]      Resistance testing if penicillin allergic       Other Labs           Other   IMP/ IUP at term with meconium         Active labor         +GBS Plan/ Admit

## 2016-11-14 NOTE — Discharge Instructions (Signed)

## 2016-11-14 NOTE — Anesthesia Pain Management Evaluation Note (Addendum)
  CRNA Pain Management Visit Note  Patient: Shannon Thomas, 26 y.o., female  "Hello I am a member of the anesthesia team at [facility]. We have an anesthesia team available at all times to provide care throughout the hospital, including epidural management and anesthesia for C-section. I don't know your plan for the delivery whether it a natural birth, water birth, IV sedation, nitrous supplementation, doula or epidural, but we want to meet your pain goals."   1.Was your pain managed to your expectations on prior hospitalizations?   No prior hospitalizations  2.What is your expectation for pain management during this hospitalization?     Epidural  3.How can we help you reach that goal? Epidural when labs are back  Record the patient's initial score and the patient's pain goal.   Pain: 8  Pain Goal: 5 [facility] wants you to be able to say your pain was always managed very well.  Edison Pace 11/14/2016

## 2016-11-14 NOTE — Anesthesia Procedure Notes (Signed)
Epidural Patient location during procedure: OB Start time: 11/14/2016 10:20 AM End time: 11/14/2016 10:41 AM  Staffing Anesthesiologist: Jairo Ben Performed: anesthesiologist   Preanesthetic Checklist Completed: patient identified, surgical consent, pre-op evaluation, timeout performed, IV checked, risks and benefits discussed and monitors and equipment checked  Epidural Patient position: sitting Prep: site prepped and draped and DuraPrep Patient monitoring: blood pressure, continuous pulse ox and heart rate Approach: midline Location: L3-L4  Needle:  Needle type: Tuohy  Needle gauge: 17 G Needle length: 9 cm Needle insertion depth: 3.5 cm Catheter type: closed end flexible Catheter size: 19 Gauge Catheter at skin depth: 9 cm Test dose: negative (1% lidocaine)  Assessment Events: blood not aspirated, injection not painful, no injection resistance, negative IV test and no paresthesia  Additional Notes Pt identified in Labor room.  Monitors applied. Working IV access confirmed. Sterile prep, drape lumbar spine.  1% lido local L 3,4.  #17ga Touhy LOR air at 3.5 cm L 3,4, cath in easily to 9 cm skin. Test dose OK, cath dosed and infusion begun.  Patient asymptomatic, VSS, no heme aspirated, tolerated well.  Sandford Craze, MDReason for block:procedure for pain

## 2016-11-15 LAB — CBC
HEMATOCRIT: 26.3 % — AB (ref 36.0–46.0)
Hemoglobin: 9.2 g/dL — ABNORMAL LOW (ref 12.0–15.0)
MCH: 27.8 pg (ref 26.0–34.0)
MCHC: 35 g/dL (ref 30.0–36.0)
MCV: 79.5 fL (ref 78.0–100.0)
Platelets: 195 10*3/uL (ref 150–400)
RBC: 3.31 MIL/uL — ABNORMAL LOW (ref 3.87–5.11)
RDW: 15.6 % — AB (ref 11.5–15.5)
WBC: 18.5 10*3/uL — ABNORMAL HIGH (ref 4.0–10.5)

## 2016-11-15 LAB — RPR: RPR: NONREACTIVE

## 2016-11-15 MED ORDER — IBUPROFEN 100 MG/5ML PO SUSP
600.0000 mg | Freq: Four times a day (QID) | ORAL | Status: DC
  Administered 2016-11-15 (×4): 600 mg via ORAL
  Filled 2016-11-15 (×7): qty 30

## 2016-11-15 MED ORDER — ACETAMINOPHEN 160 MG/5ML PO SOLN
650.0000 mg | ORAL | Status: DC | PRN
Start: 2016-11-15 — End: 2016-11-15

## 2016-11-15 NOTE — Progress Notes (Signed)
PPD#1 Pt doing well . She would like to go home VSSAF IMP/ PPD#1 doing well Plan/ Will discharge

## 2016-11-15 NOTE — Anesthesia Postprocedure Evaluation (Signed)
Anesthesia Post Note  Patient: Education administrator  Procedure(s) Performed: * No procedures listed *     Patient location during evaluation: Mother Baby Anesthesia Type: Epidural Level of consciousness: awake and alert and oriented Pain management: pain level controlled Vital Signs Assessment: post-procedure vital signs reviewed and stable Respiratory status: spontaneous breathing and nonlabored ventilation Cardiovascular status: stable Postop Assessment: no headache, patient able to bend at knees, no backache, no signs of nausea or vomiting, epidural receding and adequate PO intake Anesthetic complications: no    Last Vitals:  Vitals:   11/14/16 2100 11/15/16 0056  BP: 111/68 116/61  Pulse: 94 90  Resp: 18 18  Temp: 36.7 C 36.9 C  SpO2: 100% 99%    Last Pain:  Vitals:   11/15/16 0556  TempSrc:   PainSc: 3    Pain Goal:                 Shannon Thomas

## 2016-11-15 NOTE — Discharge Summary (Signed)
Obstetric Discharge Summary Reason for Admission: onset of labor Prenatal Procedures: ultrasound Intrapartum Procedures: spontaneous vaginal delivery Postpartum Procedures: none Complications-Operative and Postpartum: 2nd degree perineal laceration Hemoglobin  Date Value Ref Range Status  11/15/2016 9.2 (L) 12.0 - 15.0 g/dL Final   HCT  Date Value Ref Range Status  11/15/2016 26.3 (L) 36.0 - 46.0 % Final    Physical Exam:  General: alert and cooperative Lochia: appropriate Uterine Fundus: firm   Discharge Diagnoses: Term Pregnancy-delivered  Discharge Information: Date: 11/15/2016 Activity: pelvic rest Diet: routine Medications: PNV and Ibuprofen Condition: stable Instructions: refer to practice specific booklet Discharge to: home Follow-up Information    Levi Aland, MD. Schedule an appointment as soon as possible for a visit in 1 month(s).   Specialty:  Obstetrics and Gynecology Contact information: [address] [place] [address] [phone]           Newborn Data: Live born female  Birth Weight: 8 lb 2 oz (3685 g) APGAR: 9, 9  Home with mother.  Shannon Thomas 11/15/2016, 8:05 AM

## 2019-02-21 ENCOUNTER — Other Ambulatory Visit: Payer: Self-pay | Admitting: Obstetrics and Gynecology

## 2019-02-21 DIAGNOSIS — R2232 Localized swelling, mass and lump, left upper limb: Secondary | ICD-10-CM

## 2019-02-23 ENCOUNTER — Other Ambulatory Visit: Payer: Self-pay

## 2019-02-23 ENCOUNTER — Ambulatory Visit
Admission: RE | Admit: 2019-02-23 | Discharge: 2019-02-23 | Disposition: A | Discharge status: Home / Self Care | Payer: Managed Care, Other (non HMO) | Source: Ambulatory Visit | Attending: Obstetrics and Gynecology | Admitting: Obstetrics and Gynecology

## 2019-02-23 DIAGNOSIS — R2232 Localized swelling, mass and lump, left upper limb: Secondary | ICD-10-CM

## 2019-03-30 ENCOUNTER — Inpatient Hospital Stay (HOSPITAL_COMMUNITY)
Admission: AD | Admit: 2019-03-30 | Discharge: 2019-03-30 | Disposition: A | Discharge status: Home / Self Care | Payer: Managed Care, Other (non HMO) | Attending: Obstetrics and Gynecology | Admitting: Obstetrics and Gynecology

## 2019-03-30 ENCOUNTER — Other Ambulatory Visit: Payer: Self-pay

## 2019-03-30 ENCOUNTER — Encounter (HOSPITAL_COMMUNITY): Payer: Self-pay | Admitting: Obstetrics and Gynecology

## 2019-03-30 DIAGNOSIS — Z880 Allergy status to penicillin: Secondary | ICD-10-CM | POA: Diagnosis not present

## 2019-03-30 DIAGNOSIS — R109 Unspecified abdominal pain: Secondary | ICD-10-CM | POA: Diagnosis present

## 2019-03-30 DIAGNOSIS — O4703 False labor before 37 completed weeks of gestation, third trimester: Secondary | ICD-10-CM | POA: Diagnosis not present

## 2019-03-30 DIAGNOSIS — Z3A32 32 weeks gestation of pregnancy: Secondary | ICD-10-CM

## 2019-03-30 LAB — URINALYSIS, ROUTINE W REFLEX MICROSCOPIC
Bilirubin Urine: NEGATIVE
Glucose, UA: NEGATIVE mg/dL
Hgb urine dipstick: NEGATIVE
Ketones, ur: NEGATIVE mg/dL
Nitrite: NEGATIVE
Protein, ur: NEGATIVE mg/dL
Specific Gravity, Urine: 1.016 (ref 1.005–1.030)
pH: 6 (ref 5.0–8.0)

## 2019-03-30 LAB — FETAL FIBRONECTIN: Fetal Fibronectin: NEGATIVE

## 2019-03-30 NOTE — MAU Note (Signed)
.   Shannon Thomas is a 28 y.o. at 109w4d here in MAU reporting:  Braxton Hicks contractions that started around lunchtime. Lower back pain. Contractions are getting stronger. Denies any VB or LOF  Onset of complaint: lunchtime today Pain score: 7 Vitals:   03/30/19 1438  BP: 104/68  Pulse: (!) 103  Resp: 16  Temp: 97.9 F (36.6 C)  SpO2: 99%     FHT:135 Lab orders placed from triage: UA

## 2019-03-30 NOTE — MAU Provider Note (Addendum)
Chief Complaint:  Abdominal Pain and Back Pain   First Provider Initiated Contact with Patient 03/30/19 1603      HPI: Shannon Thomas is a 28 y.o. G2P1001 at 9322w4d by LMP who presents to maternity admissions reporting contractions x 4 hours improved since arrival in MAU. She reports that irregular contractions started at 11 am and then her abdomen became tight and remained tight continuously until 3 pm.  After 3, her uterus/abdomen relaxed and she is not feeling any cramping now.  The pain was low in her abdomen with tightening all over her abdomen and radiating to her low back.  There were no other associated symptoms.  She has been drinking water but has not tried any other treatments.  She reports good fetal movement.  HPI  Past Medical History: History reviewed. No pertinent past medical history.  Past obstetric history: OB History  Gravida Para Term Preterm AB Living  2 1 1     1   SAB TAB Ectopic Multiple Live Births        0 1    # Outcome Date GA Lbr Len/2nd Weight Sex Delivery Anes PTL Lv  2 Current           1 Term 11/14/16 343w6d 16:10 / 03:14 3685 g F Vag-Spont EPI  LIV    Past Surgical History: Past Surgical History:  Procedure Laterality Date  . APPENDECTOMY      Family History: History reviewed. No pertinent family history.  Social History: Social History   Tobacco Use  . Smoking status: Never Smoker  . Smokeless tobacco: Never Used  Substance Use Topics  . Alcohol use: No  . Drug use: No    Allergies:  Allergies  Allergen Reactions  . Amoxicillin Hives    Has patient had a PCN reaction causing immediate rash, facial/tongue/throat swelling, SOB or lightheadedness with hypotension: Yes Has patient had a PCN reaction causing severe rash involving mucus membranes or skin necrosis: Yes Has patient had a PCN reaction that required hospitalization No Has patient had a PCN reaction occurring within the last 10 years: No If all of the above answers are  "NO", then may proceed with Cephalosporin use.   Marland Kitchen. Penicillins Hives    Has patient had a PCN reaction causing immediate rash, facial/tongue/throat swelling, SOB or lightheadedness with hypotension: Yes Has patient had a PCN reaction causing severe rash involving mucus membranes or skin necrosis: Yes Has patient had a PCN reaction that required hospitalization No Has patient had a PCN reaction occurring within the last 10 years: No If all of the above answers are "NO", then may proceed with Cephalosporin use.    Meds:  No medications prior to admission.    ROS:  Review of Systems  Constitutional: Negative for chills, fatigue and fever.  Respiratory: Negative for shortness of breath.   Cardiovascular: Negative for chest pain.  Gastrointestinal: Positive for abdominal pain.  Genitourinary: Positive for pelvic pain. Negative for difficulty urinating, dysuria, flank pain, vaginal bleeding, vaginal discharge and vaginal pain.  Musculoskeletal: Positive for back pain.  Neurological: Negative for dizziness and headaches.  Psychiatric/Behavioral: Negative.      I have reviewed patient's Past Medical Hx, Surgical Hx, Family Hx, Social Hx, medications and allergies.   Physical Exam   Patient Vitals for the past 24 hrs:  BP Temp Pulse Resp SpO2 Height Weight  03/30/19 1632 114/65 -- 82 -- -- -- --  03/30/19 1438 104/68 97.9 F (36.6 C) (!) 103 16 99 %  5\' 6"  (1.676 m) 68.9 kg   Constitutional: Well-developed, well-nourished female in no acute distress.  Cardiovascular: normal rate Respiratory: normal effort GI: Abd soft, non-tender, gravid appropriate for gestational age.  MS: Extremities nontender, no edema, normal ROM Neurologic: Alert and oriented x 4.  GU: Neg CVAT.    Dilation: Fingertip Effacement (%): 50 Exam by:: leftwich kirby cnm  FHT:  Baseline 125  , moderate variability, accelerations present, no decelerations Contractions: None on toco or to palpation    Labs: Results for orders placed or performed during the hospital encounter of 03/30/19 (from the past 24 hour(s))  Urinalysis, Routine w reflex microscopic     Status: Abnormal   Collection Time: 03/30/19  2:48 PM  Result Value Ref Range   Color, Urine YELLOW YELLOW   APPearance CLEAR CLEAR   Specific Gravity, Urine 1.016 1.005 - 1.030   pH 6.0 5.0 - 8.0   Glucose, UA NEGATIVE NEGATIVE mg/dL   Hgb urine dipstick NEGATIVE NEGATIVE   Bilirubin Urine NEGATIVE NEGATIVE   Ketones, ur NEGATIVE NEGATIVE mg/dL   Protein, ur NEGATIVE NEGATIVE mg/dL   Nitrite NEGATIVE NEGATIVE   Leukocytes,Ua TRACE (A) NEGATIVE   RBC / HPF 0-5 0 - 5 RBC/hpf   WBC, UA 0-5 0 - 5 WBC/hpf   Bacteria, UA RARE (A) NONE SEEN   Squamous Epithelial / LPF 0-5 0 - 5   Mucus PRESENT   Fetal fibronectin     Status: None   Collection Time: 03/30/19  4:42 PM  Result Value Ref Range   Fetal Fibronectin NEGATIVE NEGATIVE      Imaging:  No results found.  MAU Course/MDM: Orders Placed This Encounter  Procedures  . Urinalysis, Routine w reflex microscopic  . Fetal fibronectin  . Discharge patient    No orders of the defined types were placed in this encounter.    NST reviewed and reactive Pt symptoms resolved.  Cervix FT/50, may be pt baseline with prior vaginal delivery.   FFN negative Recommended pt stay in MAU for recheck of cervix but pt feeling well without contractions so will discharge with return precautions F/U in office as scheduled tomorrow  Assessment: 1. Threatened preterm labor, third trimester     Plan: Discharge home Labor precautions and fetal kick counts Follow-up Information    Ob/Gyn, [facility] Follow up.   Why: As scheduled 03/31/19. Return to MAU with signs of labor or emergencies.  Contact information: [place] [address] [phone]          Allergies as of 03/30/2019      Reactions   Amoxicillin Hives   Has patient had a PCN reaction  causing immediate rash, facial/tongue/throat swelling, SOB or lightheadedness with hypotension: Yes Has patient had a PCN reaction causing severe rash involving mucus membranes or skin necrosis: Yes Has patient had a PCN reaction that required hospitalization No Has patient had a PCN reaction occurring within the last 10 years: No If all of the above answers are "NO", then may proceed with Cephalosporin use.   Penicillins Hives   Has patient had a PCN reaction causing immediate rash, facial/tongue/throat swelling, SOB or lightheadedness with hypotension: Yes Has patient had a PCN reaction causing severe rash involving mucus membranes or skin necrosis: Yes Has patient had a PCN reaction that required hospitalization No Has patient had a PCN reaction occurring within the last 10 years: No If all of the above answers are "NO", then may proceed with  Cephalosporin use.      Medication List    TAKE these medications   prenatal multivitamin Tabs tablet Take 1 tablet by mouth at bedtime.       Sharen Counter Certified Nurse-Midwife 03/30/2019 8:11 PM

## 2019-03-30 NOTE — MAU Note (Signed)
Pt needed to leave. Pt left before being given AVS.

## 2019-04-01 NOTE — L&D Delivery Note (Signed)
Delivery Note At 1:18 AM a viable and healthy child was delivered via Vaginal, Spontaneous (Presentation: Middle Occiput Posterior).  APGAR: 6, 8; weight pending .   Placenta status: Spontaneous, Intact.  Cord: 3 vessels with tight double nuchal reduced on the perineum  Anesthesia: Epidural Episiotomy: None Lacerations: 2nd degree Suture Repair: 3.0 vicryl Est. Blood Loss (mL): 293  Mom to postpartum.  Baby to Couplet care / Skin to Skin.  Waynard Reeds 05/14/2019, 1:37 AM

## 2019-04-15 DIAGNOSIS — Z369 Encounter for antenatal screening, unspecified: Secondary | ICD-10-CM | POA: Diagnosis not present

## 2019-04-27 DIAGNOSIS — Z3480 Encounter for supervision of other normal pregnancy, unspecified trimester: Secondary | ICD-10-CM | POA: Diagnosis not present

## 2019-04-27 DIAGNOSIS — Z369 Encounter for antenatal screening, unspecified: Secondary | ICD-10-CM | POA: Diagnosis not present

## 2019-04-27 LAB — OB RESULTS CONSOLE GBS: GBS: NEGATIVE

## 2019-05-04 DIAGNOSIS — Z369 Encounter for antenatal screening, unspecified: Secondary | ICD-10-CM | POA: Diagnosis not present

## 2019-05-07 ENCOUNTER — Other Ambulatory Visit: Payer: Self-pay

## 2019-05-07 ENCOUNTER — Encounter (HOSPITAL_COMMUNITY): Payer: Self-pay | Admitting: Obstetrics and Gynecology

## 2019-05-07 ENCOUNTER — Inpatient Hospital Stay (HOSPITAL_COMMUNITY)
Admission: AD | Admit: 2019-05-07 | Discharge: 2019-05-07 | Disposition: A | Discharge status: Home / Self Care | Payer: 59 | Attending: Obstetrics and Gynecology | Admitting: Obstetrics and Gynecology

## 2019-05-07 DIAGNOSIS — Z3A38 38 weeks gestation of pregnancy: Secondary | ICD-10-CM | POA: Diagnosis not present

## 2019-05-07 DIAGNOSIS — O471 False labor at or after 37 completed weeks of gestation: Secondary | ICD-10-CM | POA: Insufficient documentation

## 2019-05-07 DIAGNOSIS — O479 False labor, unspecified: Secondary | ICD-10-CM

## 2019-05-07 HISTORY — DX: Other specified health status: Z78.9

## 2019-05-07 MED ORDER — MORPHINE SULFATE (PF) 4 MG/ML IV SOLN: 2.0000 mg | Freq: Once | INTRAVENOUS | Status: DC

## 2019-05-07 MED ORDER — PROMETHAZINE HCL 25 MG/ML IJ SOLN: 25.0000 mg | Freq: Once | INTRAMUSCULAR | Status: DC

## 2019-05-07 NOTE — MAU Provider Note (Signed)
S: Ms. Shannon Thomas is a 29 y.o. G2P1001 at [redacted]w[redacted]d  who presents to MAU today complaining contractions since this afternoon.  She denies vaginal bleeding. She denies LOF. She reports normal fetal movement.    O: BP 124/70   Pulse 89   Temp 98.1 F (36.7 C)   Resp 18   LMP 08/14/2018  GENERAL: Well-developed, well-nourished female in no acute distress.  HEAD: Normocephalic, atraumatic.  CHEST: Normal effort of breathing, regular heart rate ABDOMEN: Soft, nontender, gravid  Cervical exam:  Dilation: 1 Effacement (%): 50 Cervical Position: Posterior Station: Ballotable Presentation: Vertex Exam by:: K.Wilson,RN   Fetal Monitoring: Baseline: 120 bpm Variability: moderate  Accelerations: 15x15 Decelerations: None Contractions: occasional    Offered patient pain medication and to stay for an additional 1-2 hours and then cervical recheck. Patient declined and said she would like to go  Home.   A: [facility] at [redacted]w[redacted]d  False labor  P: Discharge home with strict return precautions  Labor precautions Kick counts BP WNL    Lily Kernen, Harolyn Rutherford, NP 05/07/2019 10:39 PM

## 2019-05-07 NOTE — MAU Note (Signed)
I have communicated with J.Rasch,NP and reviewed vital signs:  Vitals:   05/07/19 2106  BP: 124/70  Pulse: 89  Resp: 18  Temp: 98.1 F (36.7 C)    Vaginal exam:  Dilation: 1 Effacement (%): 50 Cervical Position: Posterior Station: Ballotable Presentation: Vertex Exam by:: K.Melicia Esqueda,RN,   Also reviewed contraction pattern and that non-stress test is reactive.  It has been documented that patient is contracting every 10-12 minutes with no cervical change over 1 hours not indicating active labor.  Pt is very uncomfortable with ctx and crying. Offered IM morphine and phenergan (per provider). Pt declined and stated she just wanted to go home and go to bed. Based on this report provider has given order for discharge.  A discharge order and diagnosis entered by a provider.   Labor discharge instructions reviewed with patient.

## 2019-05-07 NOTE — MAU Note (Signed)
Ctx since this afternoon. Very strong. Good fetal movement  Denies any vag bleeding or leaking. 1cm on Wed in office.

## 2019-05-10 ENCOUNTER — Encounter (HOSPITAL_COMMUNITY): Payer: Self-pay | Admitting: Obstetrics and Gynecology

## 2019-05-10 ENCOUNTER — Inpatient Hospital Stay (HOSPITAL_COMMUNITY)
Admission: AD | Admit: 2019-05-10 | Discharge: 2019-05-10 | Disposition: A | Discharge status: Home / Self Care | Payer: 59 | Source: Home / Self Care | Attending: Obstetrics and Gynecology | Admitting: Obstetrics and Gynecology

## 2019-05-10 ENCOUNTER — Other Ambulatory Visit: Payer: Self-pay

## 2019-05-10 DIAGNOSIS — Z3A38 38 weeks gestation of pregnancy: Secondary | ICD-10-CM | POA: Diagnosis not present

## 2019-05-10 DIAGNOSIS — Z3A00 Weeks of gestation of pregnancy not specified: Secondary | ICD-10-CM | POA: Insufficient documentation

## 2019-05-10 DIAGNOSIS — Z20822 Contact with and (suspected) exposure to covid-19: Secondary | ICD-10-CM | POA: Diagnosis not present

## 2019-05-10 DIAGNOSIS — O471 False labor at or after 37 completed weeks of gestation: Secondary | ICD-10-CM | POA: Diagnosis not present

## 2019-05-10 DIAGNOSIS — O479 False labor, unspecified: Secondary | ICD-10-CM

## 2019-05-10 DIAGNOSIS — O26893 Other specified pregnancy related conditions, third trimester: Secondary | ICD-10-CM | POA: Diagnosis not present

## 2019-05-10 DIAGNOSIS — Z369 Encounter for antenatal screening, unspecified: Secondary | ICD-10-CM | POA: Diagnosis not present

## 2019-05-10 NOTE — Discharge Instructions (Signed)
Braxton Hicks Contractions °Contractions of the uterus can occur throughout pregnancy, but they are not always a sign that you are in labor. You may have practice contractions called Braxton Hicks contractions. These false labor contractions are sometimes confused with true labor. °What are Braxton Hicks contractions? °Braxton Hicks contractions are tightening movements that occur in the muscles of the uterus before labor. Unlike true labor contractions, these contractions do not result in opening (dilation) and thinning of the cervix. Toward the end of pregnancy (32-34 weeks), Braxton Hicks contractions can happen more often and may become stronger. These contractions are sometimes difficult to tell apart from true labor because they can be very uncomfortable. You should not feel embarrassed if you go to the hospital with false labor. °Sometimes, the only way to tell if you are in true labor is for your health care provider to look for changes in the cervix. The health care provider will do a physical exam and may monitor your contractions. If you are not in true labor, the exam should show that your cervix is not dilating and your water has not broken. °If there are no other health problems associated with your pregnancy, it is completely safe for you to be sent home with false labor. You may continue to have Braxton Hicks contractions until you go into true labor. °How to tell the difference between true labor and false labor °True labor °· Contractions last 30-70 seconds. °· Contractions become very regular. °· Discomfort is usually felt in the top of the uterus, and it spreads to the lower abdomen and low back. °· Contractions do not go away with walking. °· Contractions usually become more intense and increase in frequency. °· The cervix dilates and gets thinner. °False labor °· Contractions are usually shorter and not as strong as true labor contractions. °· Contractions are usually irregular. °· Contractions  are often felt in the front of the lower abdomen and in the groin. °· Contractions may go away when you walk around or change positions while lying down. °· Contractions get weaker and are shorter-lasting as time goes on. °· The cervix usually does not dilate or become thin. °Follow these instructions at home: ° °· Take over-the-counter and prescription medicines only as told by your health care provider. °· Keep up with your usual exercises and follow other instructions from your health care provider. °· Eat and drink lightly if you think you are going into labor. °· If Braxton Hicks contractions are making you uncomfortable: °? Change your position from lying down or resting to walking, or change from walking to resting. °? Sit and rest in a tub of warm water. °? Drink enough fluid to keep your urine pale yellow. Dehydration may cause these contractions. °? Do slow and deep breathing several times an hour. °· Keep all follow-up prenatal visits as told by your health care provider. This is important. °Contact a health care provider if: °· You have a fever. °· You have continuous pain in your abdomen. °Get help right away if: °· Your contractions become stronger, more regular, and closer together. °· You have fluid leaking or gushing from your vagina. °· You pass blood-tinged mucus (bloody show). °· You have bleeding from your vagina. °· You have low back pain that you never had before. °· You feel your baby’s head pushing down and causing pelvic pressure. °· Your baby is not moving inside you as much as it used to. °Summary °· Contractions that occur before labor are   called Braxton Hicks contractions, false labor, or practice contractions. °· Braxton Hicks contractions are usually shorter, weaker, farther apart, and less regular than true labor contractions. True labor contractions usually become progressively stronger and regular, and they become more frequent. °· Manage discomfort from Braxton Hicks contractions  by changing position, resting in a warm bath, drinking plenty of water, or practicing deep breathing. °This information is not intended to replace advice given to you by your health care provider. Make sure you discuss any questions you have with your health care provider. °Document Revised: 02/27/2017 Document Reviewed: 07/31/2016 °Elsevier Patient Education © 2020 [employer]. ° °

## 2019-05-10 NOTE — MAU Note (Signed)
I have communicated with Donette Larry, CNM and reviewed vital signs:  Vitals:   05/10/19 1650 05/10/19 1708  BP: 107/72 116/69  Pulse: (!) 101 92  Resp: 17   Temp: 98.4 F (36.9 C)   SpO2: 99%     Vaginal exam:  Dilation: 1.5 Effacement (%): 50 Cervical Position: Posterior Station: Ballotable Exam by:: [employer], RN,   Also reviewed contraction pattern and that non-stress test is reactive.  It has been documented that patient is contracting irregularly not indicating active labor.  Patient denies any other complaints.  Based on this report provider has given order for discharge.  A discharge order and diagnosis entered by a provider.   Labor discharge instructions reviewed with patient.

## 2019-05-10 NOTE — MAU Note (Addendum)
Pt states she has had ctx's since Saturday. She states she was seen in mau for them on Saturday, but was not in labor so she was sent home. Pt states she is returning with ctx's every 20 minute since then.  Denies vaginal bleeding or LOF.   Reports +FM

## 2019-05-11 DIAGNOSIS — Z369 Encounter for antenatal screening, unspecified: Secondary | ICD-10-CM | POA: Diagnosis not present

## 2019-05-13 ENCOUNTER — Other Ambulatory Visit: Payer: Self-pay

## 2019-05-13 ENCOUNTER — Encounter (HOSPITAL_COMMUNITY): Payer: Self-pay | Admitting: Obstetrics

## 2019-05-13 ENCOUNTER — Encounter (HOSPITAL_COMMUNITY): Payer: Self-pay | Admitting: Obstetrics and Gynecology

## 2019-05-13 ENCOUNTER — Inpatient Hospital Stay (EMERGENCY_DEPARTMENT_HOSPITAL)
Admission: AD | Admit: 2019-05-13 | Discharge: 2019-05-13 | Disposition: A | Discharge status: Home / Self Care | Payer: 59 | Source: Home / Self Care | Attending: Obstetrics | Admitting: Obstetrics

## 2019-05-13 ENCOUNTER — Inpatient Hospital Stay (HOSPITAL_COMMUNITY): Payer: 59 | Admitting: Anesthesiology

## 2019-05-13 ENCOUNTER — Inpatient Hospital Stay (HOSPITAL_COMMUNITY)
Admission: AD | Admit: 2019-05-13 | Discharge: 2019-05-15 | DRG: 807 | Disposition: A | Discharge status: Home / Self Care | Payer: 59 | Attending: Obstetrics and Gynecology | Admitting: Obstetrics and Gynecology

## 2019-05-13 DIAGNOSIS — O479 False labor, unspecified: Secondary | ICD-10-CM

## 2019-05-13 DIAGNOSIS — Z3A39 39 weeks gestation of pregnancy: Secondary | ICD-10-CM | POA: Diagnosis not present

## 2019-05-13 DIAGNOSIS — Z20822 Contact with and (suspected) exposure to covid-19: Secondary | ICD-10-CM | POA: Diagnosis present

## 2019-05-13 DIAGNOSIS — Z3A38 38 weeks gestation of pregnancy: Secondary | ICD-10-CM

## 2019-05-13 DIAGNOSIS — O471 False labor at or after 37 completed weeks of gestation: Secondary | ICD-10-CM | POA: Diagnosis not present

## 2019-05-13 DIAGNOSIS — O26893 Other specified pregnancy related conditions, third trimester: Secondary | ICD-10-CM | POA: Diagnosis present

## 2019-05-13 DIAGNOSIS — Z3689 Encounter for other specified antenatal screening: Secondary | ICD-10-CM

## 2019-05-13 LAB — OB RESULTS CONSOLE GC/CHLAMYDIA
Chlamydia: NEGATIVE
Gonorrhea: NEGATIVE

## 2019-05-13 LAB — CBC
HCT: 31.3 % — ABNORMAL LOW (ref 36.0–46.0)
Hemoglobin: 10.2 g/dL — ABNORMAL LOW (ref 12.0–15.0)
MCH: 24.9 pg — ABNORMAL LOW (ref 26.0–34.0)
MCHC: 32.6 g/dL (ref 30.0–36.0)
MCV: 76.3 fL — ABNORMAL LOW (ref 80.0–100.0)
Platelets: 284 10*3/uL (ref 150–400)
RBC: 4.1 MIL/uL (ref 3.87–5.11)
RDW: 15.6 % — ABNORMAL HIGH (ref 11.5–15.5)
WBC: 15.3 10*3/uL — ABNORMAL HIGH (ref 4.0–10.5)
nRBC: 0 % (ref 0.0–0.2)

## 2019-05-13 LAB — ABO/RH: ABO/RH(D): AB POS

## 2019-05-13 LAB — TYPE AND SCREEN
ABO/RH(D): AB POS
Antibody Screen: NEGATIVE

## 2019-05-13 LAB — OB RESULTS CONSOLE HIV ANTIBODY (ROUTINE TESTING): HIV: NONREACTIVE

## 2019-05-13 LAB — OB RESULTS CONSOLE RPR: RPR: NONREACTIVE

## 2019-05-13 LAB — RESPIRATORY PANEL BY RT PCR (FLU A&B, COVID)
Influenza A by PCR: NEGATIVE
Influenza B by PCR: NEGATIVE
SARS Coronavirus 2 by RT PCR: NEGATIVE

## 2019-05-13 LAB — OB RESULTS CONSOLE ANTIBODY SCREEN: Antibody Screen: NEGATIVE

## 2019-05-13 LAB — OB RESULTS CONSOLE RUBELLA ANTIBODY, IGM: Rubella: IMMUNE

## 2019-05-13 LAB — OB RESULTS CONSOLE ABO/RH: RH Type: POSITIVE

## 2019-05-13 LAB — OB RESULTS CONSOLE HEPATITIS B SURFACE ANTIGEN: Hepatitis B Surface Ag: NEGATIVE

## 2019-05-13 MED ORDER — OXYCODONE-ACETAMINOPHEN 5-325 MG PO TABS: 2.0000 | ORAL_TABLET | ORAL | Status: DC | PRN

## 2019-05-13 MED ORDER — LACTATED RINGERS IV SOLN: 500.0000 mL | INTRAVENOUS | Status: DC | PRN

## 2019-05-13 MED ORDER — PHENYLEPHRINE 40 MCG/ML (10ML) SYRINGE FOR IV PUSH (FOR BLOOD PRESSURE SUPPORT): 80.0000 ug | PREFILLED_SYRINGE | INTRAVENOUS | Status: DC | PRN

## 2019-05-13 MED ORDER — SOD CITRATE-CITRIC ACID 500-334 MG/5ML PO SOLN: 30.0000 mL | ORAL | Status: DC | PRN

## 2019-05-13 MED ORDER — EPHEDRINE 5 MG/ML INJ: 10.0000 mg | INTRAVENOUS | Status: DC | PRN

## 2019-05-13 MED ORDER — ONDANSETRON HCL 4 MG/2ML IJ SOLN
4.0000 mg | Freq: Four times a day (QID) | INTRAMUSCULAR | Status: DC | PRN
  Administered 2019-05-13: 4 mg via INTRAVENOUS
  Filled 2019-05-13: qty 2

## 2019-05-13 MED ORDER — OXYTOCIN BOLUS FROM INFUSION
500.0000 mL | Freq: Once | INTRAVENOUS | Status: AC
  Administered 2019-05-14: 500 mL/h via INTRAVENOUS

## 2019-05-13 MED ORDER — LACTATED RINGERS IV SOLN: INTRAVENOUS | Status: DC

## 2019-05-13 MED ORDER — FENTANYL CITRATE (PF) 100 MCG/2ML IJ SOLN: 50.0000 ug | INTRAMUSCULAR | Status: DC | PRN

## 2019-05-13 MED ORDER — OXYTOCIN 40 UNITS IN NORMAL SALINE INFUSION - SIMPLE MED
1.0000 m[IU]/min | INTRAVENOUS | Status: DC
  Administered 2019-05-13: 2 m[IU]/min via INTRAVENOUS

## 2019-05-13 MED ORDER — LIDOCAINE HCL (PF) 1 % IJ SOLN: 30.0000 mL | INTRAMUSCULAR | Status: DC | PRN

## 2019-05-13 MED ORDER — OXYTOCIN 40 UNITS IN NORMAL SALINE INFUSION - SIMPLE MED
2.5000 [IU]/h | INTRAVENOUS | Status: DC
  Filled 2019-05-13: qty 1000

## 2019-05-13 MED ORDER — ACETAMINOPHEN 325 MG PO TABS: 650.0000 mg | ORAL_TABLET | ORAL | Status: DC | PRN

## 2019-05-13 MED ORDER — FENTANYL-BUPIVACAINE-NACL 0.5-0.125-0.9 MG/250ML-% EP SOLN: 12.0000 mL/h | EPIDURAL | Status: DC | PRN

## 2019-05-13 MED ORDER — FENTANYL-BUPIVACAINE-NACL 0.5-0.125-0.9 MG/250ML-% EP SOLN
EPIDURAL | Status: AC
  Filled 2019-05-13: qty 250

## 2019-05-13 MED ORDER — SODIUM CHLORIDE (PF) 0.9 % IJ SOLN
INTRAMUSCULAR | Status: DC | PRN
  Administered 2019-05-13: 12 mL/h via EPIDURAL

## 2019-05-13 MED ORDER — LACTATED RINGERS IV SOLN
500.0000 mL | Freq: Once | INTRAVENOUS | Status: AC
  Administered 2019-05-13: 500 mL via INTRAVENOUS

## 2019-05-13 MED ORDER — LIDOCAINE-EPINEPHRINE (PF) 2 %-1:200000 IJ SOLN
INTRAMUSCULAR | Status: DC | PRN
  Administered 2019-05-13 (×2): 2 mL via EPIDURAL

## 2019-05-13 MED ORDER — TERBUTALINE SULFATE 1 MG/ML IJ SOLN: 0.2500 mg | Freq: Once | INTRAMUSCULAR | Status: DC | PRN

## 2019-05-13 MED ORDER — FLEET ENEMA 7-19 GM/118ML RE ENEM: 1.0000 | ENEMA | RECTAL | Status: DC | PRN

## 2019-05-13 MED ORDER — OXYCODONE-ACETAMINOPHEN 5-325 MG PO TABS: 1.0000 | ORAL_TABLET | ORAL | Status: DC | PRN

## 2019-05-13 MED ORDER — DIPHENHYDRAMINE HCL 50 MG/ML IJ SOLN: 12.5000 mg | INTRAMUSCULAR | Status: DC | PRN

## 2019-05-13 NOTE — Anesthesia Procedure Notes (Signed)
Epidural Patient location during procedure: OB Start time: 05/13/2019 5:25 PM End time: 05/13/2019 5:40 PM  Staffing Anesthesiologist: Elmer Picker, MD Performed: anesthesiologist   Preanesthetic Checklist Completed: patient identified, IV checked, risks and benefits discussed, monitors and equipment checked, pre-op evaluation and timeout performed  Epidural Patient position: sitting Prep: DuraPrep and site prepped and draped Patient monitoring: continuous pulse ox, blood pressure, heart rate and cardiac monitor Approach: midline Location: L3-L4 Injection technique: LOR air  Needle:  Needle type: Tuohy  Needle gauge: 17 G Needle length: 9 cm Needle insertion depth: 4 cm Catheter type: closed end flexible Catheter size: 19 Gauge Catheter at skin depth: 10 cm Test dose: negative  Assessment Sensory level: T8 Events: blood not aspirated, injection not painful, no injection resistance, no paresthesia and negative IV test  Additional Notes Patient identified. Risks/Benefits/Options discussed with patient including but not limited to bleeding, infection, nerve damage, paralysis, failed block, incomplete pain control, headache, blood pressure changes, nausea, vomiting, reactions to medication both or allergic, itching and postpartum back pain. Confirmed with bedside nurse the patient's most recent platelet count. Confirmed with patient that they are not currently taking any anticoagulation, have any bleeding history or any family history of bleeding disorders. Patient expressed understanding and wished to proceed. All questions were answered. Sterile technique was used throughout the entire procedure. Please see nursing notes for vital signs. Test dose was given through epidural catheter and negative prior to continuing to dose epidural or start infusion. Warning signs of high block given to the patient including shortness of breath, tingling/numbness in hands, complete motor block,  or any concerning symptoms with instructions to call for help. Patient was given instructions on fall risk and not to get out of bed. All questions and concerns addressed with instructions to call with any issues or inadequate analgesia.  Reason for block:procedure for pain

## 2019-05-13 NOTE — MAU Note (Signed)
Took patients blood pressure at discharge. Pt was sitting up and crying and asked RN not retake her blood pressure. Pt stated "I want this thing off of me and I want to get dressed and go home." Pt mentioned "I know that if I was at [facility] or [facility] things would go different." Provider notified.

## 2019-05-13 NOTE — MAU Provider Note (Addendum)
S: Ms. Shannon Thomas is a 29 y.o. G2P1001 at [redacted]w[redacted]d  who presents to MAU today for labor evaluation.     Cervical exam by RN:  Dilation: 2 Effacement (%): 60 Station: -3 Presentation: Vertex Exam by:: Manuela Neptune, RN  Fetal Monitoring: Baseline: 135 Variability: average Accelerations: present Decelerations: absent Contractions: irregular every 6-40min  Cervix unchanged over time  MDM Discussed patient with RN. NST reviewed.  Just as RN was discharging patient, she noted an elevated BP and repeated it, also elevated.  Patient was sitting up with legs crossed and crying.  RN asked her to lie back to reassess and she refused. She notified me of BPs and I proceeded to go assess pt and recommend Preeclampsia workup but patient had already left before I got to room. Prior BPs were low. (107/72 and 116/69 on 05/10/19)  Vitals:   05/13/19 0405 05/13/19 0407  BP:  113/79  Pulse:  (!) 107  Resp: 18   Temp: 98.3 F (36.8 C)   Weight: 73.5 kg   Height: 5\' 6"  (1.676 m)      A: [facility] at [redacted]w[redacted]d  False labor  P: Discharge home Labor precautions and kick counts included in AVS Patient to follow-up with office as scheduled  Patient may return to MAU as needed or when in labor   [redacted]w[redacted]d 05/13/2019 5:32 AM

## 2019-05-13 NOTE — Discharge Instructions (Signed)

## 2019-05-13 NOTE — MAU Note (Signed)
. .  Shannon Thomas is a 29 y.o. at [redacted]w[redacted]d here in MAU reporting: ctx that are more intense since this morning. She reports that they are 8 minutes apart. No LOF. Patient reports spotting since cervical exam. Endorses good fetal movement.   Pain score: 8 Vitals:   05/13/19 1535  Pulse: 100  Resp: 17  Temp: 97.9 F (36.6 C)  SpO2: 100%     FHT: 152 Lab orders placed from triage:

## 2019-05-13 NOTE — MAU Note (Signed)
I have communicated with Wynelle Bourgeois, CNM and reviewed vital signs:  Vitals:   05/13/19 0532 05/13/19 0536  BP: (!) 137/95 (!) 134/113  Pulse: (!) 101 94  Resp:    Temp:      Vaginal exam:  Dilation: 2 Effacement (%): 60 Station: -3 Presentation: Vertex Exam by:: Manuela Neptune, RN,   Also reviewed contraction pattern and that non-stress test is reactive.  It has been documented that patient is contracting every 6-7 minutes with no cervical change over 1 hour not indicating active labor.  Patient denies any other complaints.  Based on this report provider has given order for discharge.  A discharge order and diagnosis entered by a provider.   Labor discharge instructions reviewed with patient.

## 2019-05-13 NOTE — Anesthesia Preprocedure Evaluation (Addendum)

## 2019-05-13 NOTE — MAU Note (Signed)
Ctxs since 0100 and stronger now. My lower back is "killing me". Denies LOF or vag bleeding. 2cm on Weds

## 2019-05-14 ENCOUNTER — Encounter (HOSPITAL_COMMUNITY): Payer: Self-pay | Admitting: Obstetrics and Gynecology

## 2019-05-14 LAB — CBC
HCT: 26.6 % — ABNORMAL LOW (ref 36.0–46.0)
Hemoglobin: 8.8 g/dL — ABNORMAL LOW (ref 12.0–15.0)
MCH: 25.2 pg — ABNORMAL LOW (ref 26.0–34.0)
MCHC: 33.1 g/dL (ref 30.0–36.0)
MCV: 76.2 fL — ABNORMAL LOW (ref 80.0–100.0)
Platelets: 239 10*3/uL (ref 150–400)
RBC: 3.49 MIL/uL — ABNORMAL LOW (ref 3.87–5.11)
RDW: 15.6 % — ABNORMAL HIGH (ref 11.5–15.5)
WBC: 20.9 10*3/uL — ABNORMAL HIGH (ref 4.0–10.5)
nRBC: 0 % (ref 0.0–0.2)

## 2019-05-14 LAB — RPR: RPR Ser Ql: NONREACTIVE

## 2019-05-14 MED ORDER — WITCH HAZEL-GLYCERIN EX PADS: 1.0000 "application " | MEDICATED_PAD | CUTANEOUS | Status: DC | PRN

## 2019-05-14 MED ORDER — DIPHENHYDRAMINE HCL 25 MG PO CAPS: 25.0000 mg | ORAL_CAPSULE | Freq: Four times a day (QID) | ORAL | Status: DC | PRN

## 2019-05-14 MED ORDER — ACETAMINOPHEN 325 MG PO TABS: 650.0000 mg | ORAL_TABLET | ORAL | Status: DC | PRN

## 2019-05-14 MED ORDER — METHYLERGONOVINE MALEATE 0.2 MG/ML IJ SOLN: 0.2000 mg | INTRAMUSCULAR | Status: DC | PRN

## 2019-05-14 MED ORDER — ONDANSETRON HCL 4 MG PO TABS: 4.0000 mg | ORAL_TABLET | ORAL | Status: DC | PRN

## 2019-05-14 MED ORDER — TETANUS-DIPHTH-ACELL PERTUSSIS 5-2.5-18.5 LF-MCG/0.5 IM SUSP: 0.5000 mL | Freq: Once | INTRAMUSCULAR | Status: DC

## 2019-05-14 MED ORDER — IBUPROFEN 100 MG/5ML PO SUSP
600.0000 mg | Freq: Four times a day (QID) | ORAL | Status: DC
  Administered 2019-05-14 – 2019-05-15 (×5): 600 mg via ORAL
  Filled 2019-05-14 (×5): qty 30

## 2019-05-14 MED ORDER — BENZOCAINE-MENTHOL 20-0.5 % EX AERO: 1.0000 "application " | INHALATION_SPRAY | CUTANEOUS | Status: DC | PRN

## 2019-05-14 MED ORDER — METHYLERGONOVINE MALEATE 0.2 MG PO TABS: 0.2000 mg | ORAL_TABLET | ORAL | Status: DC | PRN

## 2019-05-14 MED ORDER — SIMETHICONE 80 MG PO CHEW: 80.0000 mg | CHEWABLE_TABLET | ORAL | Status: DC | PRN

## 2019-05-14 MED ORDER — ACETAMINOPHEN 160 MG/5ML PO SOLN: 650.0000 mg | ORAL | Status: DC | PRN

## 2019-05-14 MED ORDER — COMPLETENATE 29-1 MG PO CHEW
1.0000 | CHEWABLE_TABLET | Freq: Every day | ORAL | Status: DC
  Administered 2019-05-14: 1 via ORAL
  Filled 2019-05-14: qty 1

## 2019-05-14 MED ORDER — PRENATAL MULTIVITAMIN CH: 1.0000 | ORAL_TABLET | Freq: Every day | ORAL | Status: DC

## 2019-05-14 MED ORDER — IBUPROFEN 600 MG PO TABS
600.0000 mg | ORAL_TABLET | Freq: Four times a day (QID) | ORAL | Status: DC
  Filled 2019-05-14: qty 1

## 2019-05-14 MED ORDER — OXYCODONE HCL 5 MG PO TABS: 5.0000 mg | ORAL_TABLET | ORAL | Status: DC | PRN

## 2019-05-14 MED ORDER — AMMONIA AROMATIC IN INHA
RESPIRATORY_TRACT | Status: AC
  Filled 2019-05-14: qty 10

## 2019-05-14 MED ORDER — ONDANSETRON HCL 4 MG/2ML IJ SOLN: 4.0000 mg | INTRAMUSCULAR | Status: DC | PRN

## 2019-05-14 MED ORDER — OXYCODONE HCL 5 MG PO TABS: 10.0000 mg | ORAL_TABLET | ORAL | Status: DC | PRN

## 2019-05-14 MED ORDER — SENNOSIDES-DOCUSATE SODIUM 8.6-50 MG PO TABS
2.0000 | ORAL_TABLET | ORAL | Status: DC
  Filled 2019-05-14: qty 2

## 2019-05-14 MED ORDER — ZOLPIDEM TARTRATE 5 MG PO TABS: 5.0000 mg | ORAL_TABLET | Freq: Every evening | ORAL | Status: DC | PRN

## 2019-05-14 MED ORDER — COCONUT OIL OIL: 1.0000 "application " | TOPICAL_OIL | Status: DC | PRN

## 2019-05-14 MED ORDER — DIBUCAINE (PERIANAL) 1 % EX OINT: 1.0000 "application " | TOPICAL_OINTMENT | CUTANEOUS | Status: DC | PRN

## 2019-05-14 NOTE — Anesthesia Postprocedure Evaluation (Signed)
Anesthesia Post Note  Patient: Education administrator  Procedure(s) Performed: AN AD HOC LABOR EPIDURAL     Patient location during evaluation: Mother Baby Anesthesia Type: Epidural Level of consciousness: awake and alert Pain management: pain level controlled Vital Signs Assessment: post-procedure vital signs reviewed and stable Respiratory status: spontaneous breathing Cardiovascular status: stable Postop Assessment: no backache, no headache, patient able to bend at knees, able to ambulate and epidural receding Anesthetic complications: no    Last Vitals:  Vitals:   05/14/19 1143 05/14/19 1554  BP: (!) 102/55 (!) 92/58  Pulse: 61 79  Resp: 16 16  Temp: 37 C 36.8 C  SpO2: 98% 100%    Last Pain:  Vitals:   05/14/19 1554  TempSrc: Oral  PainSc: 0-No pain   Pain Goal:                   Edison Pace

## 2019-05-14 NOTE — Progress Notes (Signed)
Post Partum Day 0 Subjective: no complaints, up ad lib, voiding, tolerating PO and + flatus  Objective: Blood pressure (!) 102/55, pulse 61, temperature 98.6 F (37 C), temperature source Oral, resp. rate 16, height 5\' 6"  (1.676 m), weight 72.6 kg, last menstrual period 08/14/2018, SpO2 98 %, unknown if currently breastfeeding.  Physical Exam:  General: alert, cooperative and appears stated age Lochia: appropriate Uterine Fundus: firm DVT Evaluation: No evidence of DVT seen on physical exam.  Recent Labs    05/13/19 1627 05/14/19 0503  HGB 10.2* 8.8*  HCT 31.3* 26.6*    Assessment/Plan: Plan for discharge tomorrow and Breastfeeding  Desires neonatal circumcision, R/B/A of procedure discussed at length. Pt understands that neonatal circumcision is not considered medically necessary and is elective. The risks include, but are not limited to bleeding, infection, damage to the penis, development of scar tissue, and having to have it redone at a later date. Pt understands theses risks and wishes to proceed. Baby has yet to void    LOS: 1 day   05/16/19 05/14/2019, 1:06 PM

## 2019-05-14 NOTE — H&P (Signed)
Shannon Thomas is a 29 y.o. female presenting for labor  29 yo G2P1001 @ 38+6 presents to MAU in labor. Her pregnancy has been uncomplicated OB History    Gravida  2   Para  1   Term  1   Preterm      AB      Living  1     SAB      TAB      Ectopic      Multiple  0   Live Births  1          Past Medical History:  Diagnosis Date  . Medical history non-contributory    Past Surgical History:  Procedure Laterality Date  . APPENDECTOMY    . WISDOM TOOTH EXTRACTION     Family History: family history is not on file. Social History:  reports that she has never smoked. She has never used smokeless tobacco. She reports that she does not drink alcohol or use drugs.     Maternal Diabetes: No Genetic Screening: Normal Maternal Ultrasounds/Referrals: Normal Fetal Ultrasounds or other Referrals:  None Maternal Substance Abuse:  No Significant Maternal Medications:  None Significant Maternal Lab Results:  None Other Comments:  None  Review of Systems History Dilation: 10 Effacement (%): 100 Station: 0 Exam by:: Bre Price RN Blood pressure (!) 113/56, pulse (!) 109, temperature 98.3 F (36.8 C), temperature source Oral, resp. rate 20, height 5\' 6"  (1.676 m), weight 72.6 kg, last menstrual period 08/14/2018, SpO2 100 %, unknown if currently breastfeeding. Exam Physical Exam  Prenatal labs: ABO, Rh: --/--/AB POS, AB POS Performed at [facility], [address], [place], [place]  [phone]) Antibody: NEG (02/12 1627) Rubella: Immune (02/12 0000) RPR: Nonreactive (02/12 0000)  HBsAg: Negative (02/12 0000)  HIV: Non-reactive (02/12 0000)  GBS: Negative/-- (01/27 0000)   Assessment/Plan: 1) Admit 2) Epidural  3) Anticipate SVD   11-07-2003 05/14/2019, 1:39 AM

## 2019-05-15 MED ORDER — IBUPROFEN 600 MG PO TABS: 600.0000 mg | ORAL_TABLET | Freq: Four times a day (QID) | ORAL | 0 refills | Status: AC | PRN

## 2019-05-15 NOTE — Discharge Summary (Signed)
Obstetric Discharge Summary Reason for Admission: onset of labor Prenatal Procedures: NST and ultrasound Intrapartum Procedures: spontaneous vaginal delivery Postpartum Procedures: none Complications-Operative and Postpartum: 2nd degree perineal laceration Hemoglobin  Date Value Ref Range Status  05/14/2019 8.8 (L) 12.0 - 15.0 g/dL Final    Comment:    Reticulocyte Hemoglobin testing may be clinically indicated, consider ordering this additional test [id number]    HCT  Date Value Ref Range Status  05/14/2019 26.6 (L) 36.0 - 46.0 % Final    Physical Exam:  General: alert, cooperative and appears stated age 29: appropriate Uterine Fundus: firm DVT Evaluation: No evidence of DVT seen on physical exam.  Discharge Diagnoses: Term Pregnancy-delivered  Discharge Information: Date: 05/15/2019 Activity: pelvic rest Diet: routine Medications: Ibuprofen Condition: improved Instructions: refer to practice specific booklet Discharge to: home Follow-up Information    Waynard Reeds, MD Follow up in 4 week(s).   Specialty: Obstetrics and Gynecology Why: For a postpartum evaluation Contact information: [address] [place] [address] [phone]           Newborn Data: Live born female  Birth Weight: 7 lb (3175 g) APGAR: 7, 9  Newborn Delivery   Birth date/time: 05/14/2019 01:18:00 Delivery type: Vaginal, Spontaneous      Home with mother.  Waynard Reeds 05/15/2019, 12:01 PM

## 2019-05-19 ENCOUNTER — Other Ambulatory Visit: Payer: Self-pay

## 2019-05-19 ENCOUNTER — Inpatient Hospital Stay (HOSPITAL_COMMUNITY)
Admission: AD | Admit: 2019-05-19 | Discharge: 2019-05-19 | Disposition: A | Discharge status: Home / Self Care | Payer: 59 | Attending: Obstetrics and Gynecology | Admitting: Obstetrics and Gynecology

## 2019-05-19 ENCOUNTER — Encounter (HOSPITAL_COMMUNITY): Payer: Self-pay | Admitting: Obstetrics and Gynecology

## 2019-05-19 DIAGNOSIS — K640 First degree hemorrhoids: Secondary | ICD-10-CM | POA: Diagnosis not present

## 2019-05-19 DIAGNOSIS — O9089 Other complications of the puerperium, not elsewhere classified: Secondary | ICD-10-CM

## 2019-05-19 DIAGNOSIS — Z48816 Encounter for surgical aftercare following surgery on the genitourinary system: Secondary | ICD-10-CM | POA: Diagnosis not present

## 2019-05-19 DIAGNOSIS — N939 Abnormal uterine and vaginal bleeding, unspecified: Secondary | ICD-10-CM | POA: Insufficient documentation

## 2019-05-19 DIAGNOSIS — R52 Pain, unspecified: Secondary | ICD-10-CM | POA: Diagnosis present

## 2019-05-19 DIAGNOSIS — Z880 Allergy status to penicillin: Secondary | ICD-10-CM | POA: Insufficient documentation

## 2019-05-19 DIAGNOSIS — K644 Residual hemorrhoidal skin tags: Secondary | ICD-10-CM | POA: Diagnosis not present

## 2019-05-19 MED ORDER — LIDOCAINE 5 % EX OINT: 1.0000 "application " | TOPICAL_OINTMENT | CUTANEOUS | 0 refills | Status: AC | PRN

## 2019-05-19 MED ORDER — WITCH HAZEL-GLYCERIN EX PADS: 1.0000 "application " | MEDICATED_PAD | CUTANEOUS | 12 refills | Status: AC | PRN

## 2019-05-19 NOTE — MAU Note (Addendum)
Presents with c/o grape size area noted on episiotomy.  Reports area is very sore and hard to sit. S/P NVD 05/14/2019

## 2019-05-19 NOTE — MAU Provider Note (Signed)
History     CSN: [id number]  Arrival date and time: 05/19/19 1206   First Provider Initiated Contact with Patient 05/19/19 1251      Chief Complaint  Patient presents with  . Episiotomy check   HPI Ms. Shannon Thomas is a 29 y.o. [id number] s/p SVD PPD #5 who presents to MAU today with complaint of pain and swelling near her perineal laceration. The patient had a 2nd degree laceration repair after SVD on 05/14/19. She states that she initially noted pain and felt the swelling on 05/16/19. She feels it has gotten worse since then. She rates her pain at 8/10 now. She is not taking anything for pain. She states that she has difficulty with oral medications. She has had BM since delivery. She denies abnormal discharge or odor. She is still having vaginal bleeding. She denies fever or UTI symptoms.    OB History    Gravida  2   Para  2   Term  2   Preterm      AB      Living  2     SAB      TAB      Ectopic      Multiple  0   Live Births  2           Past Medical History:  Diagnosis Date  . Medical history non-contributory     Past Surgical History:  Procedure Laterality Date  . APPENDECTOMY    . WISDOM TOOTH EXTRACTION      History reviewed. No pertinent family history.  Social History   Tobacco Use  . Smoking status: Never Smoker  . Smokeless tobacco: Never Used  Substance Use Topics  . Alcohol use: No  . Drug use: No    Allergies:  Allergies  Allergen Reactions  . Amoxicillin Hives    Has patient had a PCN reaction causing immediate rash, facial/tongue/throat swelling, SOB or lightheadedness with hypotension: Yes Has patient had a PCN reaction causing severe rash involving mucus membranes or skin necrosis: Yes Has patient had a PCN reaction that required hospitalization No Has patient had a PCN reaction occurring within the last 10 years: No If all of the above answers are "NO", then may proceed with Cephalosporin use.   Marland Kitchen Penicillins Hives     Has patient had a PCN reaction causing immediate rash, facial/tongue/throat swelling, SOB or lightheadedness with hypotension: Yes Has patient had a PCN reaction causing severe rash involving mucus membranes or skin necrosis: Yes Has patient had a PCN reaction that required hospitalization No Has patient had a PCN reaction occurring within the last 10 years: No If all of the above answers are "NO", then may proceed with Cephalosporin use.    No medications prior to admission.    Review of Systems  Constitutional: Negative for fever.  Gastrointestinal: Negative for abdominal pain.  Genitourinary: Positive for vaginal bleeding and vaginal pain. Negative for vaginal discharge.   Physical Exam   Blood pressure 106/68, pulse (!) 111, temperature 98.3 F (36.8 C), temperature source Oral, resp. rate 20, height 5\' 6"  (1.676 m), weight 66.3 kg, SpO2 100 %, unknown if currently breastfeeding.  Physical Exam  Constitutional: She is oriented to person, place, and time. She appears well-developed and well-nourished. No distress.  Cardiovascular: Tachycardia present.  Respiratory: Effort normal. No respiratory distress.  GI: Soft. She exhibits no distension.  Genitourinary: Rectum:     Tenderness and external hemorrhoid (small, grade 1 hemorrhoid noted without  bleeding, non-thrombosed) present.     Vaginal tenderness (mild tenderness at site of perineal repair, healing well without surrounding erythema or edema noted) and bleeding present.     No vaginal discharge or erythema.  There is tenderness (mild tenderness at site of perineal repair, healing well without surrounding erythema or edema noted) and bleeding in the vagina. No erythema in the vagina.  Neurological: She is alert and oriented to person, place, and time.  Skin: Skin is warm and dry. No erythema.  Psychiatric: She has a normal mood and affect.     MAU Course  Procedures None  MDM Reviewed delivery note - 2nd degree  laceration repair after delivery  No evidence of infection or dehiscence of perineal laceration.  Assessment and Plan  A: PPD #5 S/P SVD  H/O 2nd Degree laceration  Hemorrhoid, grade 1   P: Discharge home Rx for Tucks pads and lidocaine ointment sent to patient's pharmacy  Continue OTC hemorrhoid cream for anti-inflammatory component as directed  Warning signs for worsening condition discussed Patient advised to follow-up with [facility] as scheduled for routine postpartum care or sooner if symptoms worsen or concern for infection  Patient may return to MAU as needed or if her condition were to change or worsen   Vonzella Nipple, PA-C 05/19/2019, 1:35 PM

## 2019-05-19 NOTE — Discharge Instructions (Signed)
Hemorrhoids Hemorrhoids are swollen veins that may develop:  In the butt (rectum). These are called internal hemorrhoids.  Around the opening of the butt (anus). These are called external hemorrhoids. Hemorrhoids can cause pain, itching, or bleeding. Most of the time, they do not cause serious problems. They usually get better with diet changes, lifestyle changes, and other home treatments. What are the causes? This condition may be caused by:  Having trouble pooping (constipation).  Pushing hard (straining) to poop.  Watery poop (diarrhea).  Pregnancy.  Being very overweight (obese).  Sitting for long periods of time.  Heavy lifting or other activity that causes you to strain.  Anal sex.  Riding a bike for a long period of time. What are the signs or symptoms? Symptoms of this condition include:  Pain.  Itching or soreness in the butt.  Bleeding from the butt.  Leaking poop.  Swelling in the area.  One or more lumps around the opening of your butt. How is this diagnosed? A doctor can often diagnose this condition by looking at the affected area. The doctor may also:  Do an exam that involves feeling the area with a gloved hand (digital rectal exam).  Examine the area inside your butt using a small tube (anoscope).  Order blood tests. This may be done if you have lost a lot of blood.  Have you get a test that involves looking inside the colon using a flexible tube with a camera on the end (sigmoidoscopy or colonoscopy). How is this treated? This condition can usually be treated at home. Your doctor may tell you to change what you eat, make lifestyle changes, or try home treatments. If these do not help, procedures can be done to remove the hemorrhoids or make them smaller. These may involve:  Placing rubber bands at the base of the hemorrhoids to cut off their blood supply.  Injecting medicine into the hemorrhoids to shrink them.  Shining a type of light  energy onto the hemorrhoids to cause them to fall off.  Doing surgery to remove the hemorrhoids or cut off their blood supply. Follow these instructions at home: Eating and drinking   Eat foods that have a lot of fiber in them. These include whole grains, beans, nuts, fruits, and vegetables.  Ask your doctor about taking products that have added fiber (fibersupplements).  Reduce the amount of fat in your diet. You can do this by: ? Eating low-fat dairy products. ? Eating less red meat. ? Avoiding processed foods.  Drink enough fluid to keep your pee (urine) pale yellow. Managing pain and swelling   Take a warm-water bath (sitz bath) for 20 minutes to ease pain. Do this 3-4 times a day. You may do this in a bathtub or using a portable sitz bath that fits over the toilet.  If told, put ice on the painful area. It may be helpful to use ice between your warm baths. ? Put ice in a plastic bag. ? Place a towel between your skin and the bag. ? Leave the ice on for 20 minutes, 2-3 times a day. General instructions  Take over-the-counter and prescription medicines only as told by your doctor. ? Medicated creams and medicines may be used as told.  Exercise often. Ask your doctor how much and what kind of exercise is best for you.  Go to the bathroom when you have the urge to poop. Do not wait.  Avoid pushing too hard when you poop.  Keep your   butt dry and clean. Use wet toilet paper or moist towelettes after pooping.  Do not sit on the toilet for a long time.  Keep all follow-up visits as told by your doctor. This is important. Contact a doctor if you:  Have pain and swelling that do not get better with treatment or medicine.  Have trouble pooping.  Cannot poop.  Have pain or swelling outside the area of the hemorrhoids. Get help right away if you have:  Bleeding that will not stop. Summary  Hemorrhoids are swollen veins in the butt or around the opening of the  butt.  They can cause pain, itching, or bleeding.  Eat foods that have a lot of fiber in them. These include whole grains, beans, nuts, fruits, and vegetables.  Take a warm-water bath (sitz bath) for 20 minutes to ease pain. Do this 3-4 times a day. This information is not intended to replace advice given to you by your health care provider. Make sure you discuss any questions you have with your health care provider. Document Revised: 03/25/2018 Document Reviewed: 08/06/2017 Elsevier Patient Education  2020 [place] of a Perineal Tear A perineal tear is a cut or tear (laceration) in the tissue between the opening of the vagina and the anus (perineum). Some women develop a perineal tear during a vaginal birth. This can happen as the baby emerges from the birth canal and the perineum is stretched. There are four degrees of perineal tears based on how deep and long the laceration is:  First degree. This involves a shallow tear at the edge of the vaginal opening that extends slightly into the perineal skin.  Second degree. This involves tearing described in first degree perineal tear, and an additional deeper tear of the vaginal opening and perineal tissues. It may also include tearing of a muscle just under the perineal skin.  Third degree. This involves tearing described in first and second degree perineal tears, with the addition that tearing in the third degree extends into the muscle of the anus (anal sphincter).  Fourth degree. This involves all levels of tears described in first, second, and third degree perineal tears, with the tear in the fourth degree extending into the rectum. First and second degree perineal tears may or may not be stitched closed, depending on their location and appearance. Third and fourth degree perineal tears are stitched closed immediately after the baby's birth. What are the risks? Depending on the type of perineal tear you have, you may be at  risk for:  Bleeding.  Developing a collection of blood in the perineal tear area (hematoma).  Pain. This may include pain when you urinate, or pain when you have a bowel movement.  Infection at the site of the tear.  Fever.  Trouble controlling your urination or bowels (incontinence).  Painful sex. How to care for a perineal tear Wound care  Take a sitz bath as told by your health care provider. A sitz bath is a warm water bath that is taken while you are sitting down. The water should only come up to your hips and should cover your buttocks. This can speed up healing. 1. Partially fill a bathtub with warm water. You will only need the water to be deep enough to cover your hips and buttocks when you are sitting in it. 2. If your health care provider told you to put medicine in the water, follow the directions exactly as told. 3. Sit in the  water and open the tub drain a little. 4. Turn on the warm water again to keep the tub at the correct level. Keep the water running constantly. 5. Soak in the water for 15-20 minutes or as told by your health care provider. 6. After the sitz bath, pat the affected area dry first. Do not rub it. 7. Be careful when you stand up after the sitz bath because you may feel dizzy.  Wash your hands before and after applying medicine to the area.  Wear a sanitary pad as told by your health care provider. Change the pad as often as told by your health care provider.  Leave stitches (sutures), skin glue, or adhesive strips in place. These skin closures may need to stay in place for 2 weeks or longer. If adhesive strip edges start to loosen and curl up, you may trim the loose edges. Do not remove adhesive strips completely unless your health care provider tells you to do that.  Check your wound every day for signs of infection. Check for: ? Redness, swelling, or pain. ? Fluid or blood. ? Warmth. ? Pus or a bad smell. Managing pain  If directed, put ice  on the painful area: ? Put ice in a plastic bag. ? Place a towel between your skin and the bag. ? Leave the ice on for 20 minutes, 2-3 times a day.  Apply a numbing spray to the perineal tear site as told by your health care provider. This may help with discomfort.  Take and apply over-the-counter and prescription medicines only as told by your health care provider.  If told, put about 3 witch hazel-containing hemorrhoid treatment pads on top of your sanitary pad. The witch hazel in the hemorrhoid pads helps with swelling and discomfort.  Sit on an inflatable ring or pillow. This may provide comfort. General instructions  Squeeze warm water on your perineum after urinating. This should be done from front to back with a squeeze bottle. Pat the area to dry it.  Do not have sex, use tampons, or place anything in your vagina for at least 6 weeks or as told by your health care provider.  Keep all follow-up visits as told by your health care provider. These include any postpartum visits. This is important. Contact a health care provider if:  Your pain is not relieved with medicines.  You have painful urination.  You have redness, swelling, or pain around your tear.  You have fluid or blood coming from your tear.  Your tear feels warm to the touch.  You have pus or a bad smell coming from your tear.  You have a fever. Get help right away if:  Your tear opens.  You cannot urinate.  You have an increase in bleeding.  You have severe pain. Summary  A perineal tear is a cut or tear (laceration) in the tissue between the opening of the vagina and the anus (perineum).  There are four degrees of perineal tears based on how deep and long the laceration is.  First and second-degree perineal tears may or may not be stitched closed, depending on their location and appearance. Third and fourth- degree perineal tears are stitched closed immediately after the baby's birth.  Follow your  health care provider's instructions for caring for your perineal tear. Know how to manage pain and how to care for your wound. Know when to call your health care provider and when to seek immediate emergency care. This information is not  intended to replace advice given to you by your health care provider. Make sure you discuss any questions you have with your health care provider. Document Revised: 02/27/2017 Document Reviewed: 04/21/2016 Elsevier Patient Education  2020 [employer].

## 2019-05-19 NOTE — Progress Notes (Signed)
Harlon Flor, PA into see pt and eval pt for presenting complaint.

## 2019-06-13 DIAGNOSIS — Z124 Encounter for screening for malignant neoplasm of cervix: Secondary | ICD-10-CM | POA: Diagnosis not present

## 2019-07-04 DIAGNOSIS — Z3202 Encounter for pregnancy test, result negative: Secondary | ICD-10-CM | POA: Diagnosis not present

## 2019-07-04 DIAGNOSIS — Z30430 Encounter for insertion of intrauterine contraceptive device: Secondary | ICD-10-CM | POA: Diagnosis not present

## 2019-08-26 DIAGNOSIS — Z30431 Encounter for routine checking of intrauterine contraceptive device: Secondary | ICD-10-CM | POA: Diagnosis not present

## 2019-10-05 DIAGNOSIS — F53 Postpartum depression: Secondary | ICD-10-CM | POA: Diagnosis not present

## 2019-10-05 DIAGNOSIS — F411 Generalized anxiety disorder: Secondary | ICD-10-CM | POA: Diagnosis not present
